# Patient Record
Sex: Female | Born: 1975 | Hispanic: No | Marital: Married | Smoking: Never smoker
Health system: Southern US, Community
[De-identification: ages and names within clinical notes are randomized; demographics above are authoritative.]

## PROBLEM LIST (undated history)

## (undated) ENCOUNTER — Inpatient Hospital Stay (HOSPITAL_COMMUNITY): Payer: Self-pay

## (undated) DIAGNOSIS — Z789 Other specified health status: Secondary | ICD-10-CM

## (undated) HISTORY — PX: NO PAST SURGERIES: SHX2092

## (undated) HISTORY — DX: Other specified health status: Z78.9

---

## 1997-07-29 DIAGNOSIS — K2970 Gastritis, unspecified, without bleeding: Secondary | ICD-10-CM

## 1997-07-29 HISTORY — DX: Gastritis, unspecified, without bleeding: K29.70

## 2001-04-10 ENCOUNTER — Other Ambulatory Visit: Admission: RE | Admit: 2001-04-10 | Discharge: 2001-04-10 | Payer: Self-pay | Admitting: Obstetrics and Gynecology

## 2001-05-06 ENCOUNTER — Encounter: Admission: RE | Admit: 2001-05-06 | Discharge: 2001-08-04 | Payer: Self-pay | Admitting: Obstetrics and Gynecology

## 2001-11-01 ENCOUNTER — Inpatient Hospital Stay (HOSPITAL_COMMUNITY): Admission: AD | Admit: 2001-11-01 | Discharge: 2001-11-04 | Payer: Self-pay | Admitting: *Deleted

## 2002-06-10 ENCOUNTER — Encounter: Admission: RE | Admit: 2002-06-10 | Discharge: 2002-06-10 | Payer: Self-pay | Admitting: Family Medicine

## 2002-07-07 ENCOUNTER — Encounter: Admission: RE | Admit: 2002-07-07 | Discharge: 2002-07-07 | Payer: Self-pay | Admitting: Family Medicine

## 2002-07-09 ENCOUNTER — Ambulatory Visit (HOSPITAL_COMMUNITY): Admission: RE | Admit: 2002-07-09 | Discharge: 2002-07-09 | Payer: Self-pay | Admitting: *Deleted

## 2002-07-09 ENCOUNTER — Emergency Department (HOSPITAL_COMMUNITY): Admission: EM | Admit: 2002-07-09 | Discharge: 2002-07-10 | Payer: Self-pay | Admitting: Emergency Medicine

## 2002-07-14 ENCOUNTER — Encounter: Admission: RE | Admit: 2002-07-14 | Discharge: 2002-07-14 | Payer: Self-pay | Admitting: Family Medicine

## 2002-08-10 ENCOUNTER — Encounter: Admission: RE | Admit: 2002-08-10 | Discharge: 2002-08-10 | Payer: Self-pay | Admitting: Family Medicine

## 2002-08-18 ENCOUNTER — Ambulatory Visit (HOSPITAL_COMMUNITY): Admission: RE | Admit: 2002-08-18 | Discharge: 2002-08-18 | Payer: Self-pay | Admitting: Family Medicine

## 2002-09-30 ENCOUNTER — Encounter: Admission: RE | Admit: 2002-09-30 | Discharge: 2002-09-30 | Payer: Self-pay | Admitting: Family Medicine

## 2002-10-01 ENCOUNTER — Encounter: Admission: RE | Admit: 2002-10-01 | Discharge: 2002-10-01 | Payer: Self-pay | Admitting: Family Medicine

## 2002-11-16 ENCOUNTER — Encounter: Admission: RE | Admit: 2002-11-16 | Discharge: 2002-11-16 | Payer: Self-pay | Admitting: Family Medicine

## 2002-12-01 ENCOUNTER — Encounter: Admission: RE | Admit: 2002-12-01 | Discharge: 2002-12-01 | Payer: Self-pay | Admitting: Family Medicine

## 2002-12-15 ENCOUNTER — Encounter: Admission: RE | Admit: 2002-12-15 | Discharge: 2002-12-15 | Payer: Self-pay | Admitting: Family Medicine

## 2002-12-22 ENCOUNTER — Encounter: Admission: RE | Admit: 2002-12-22 | Discharge: 2002-12-22 | Payer: Self-pay | Admitting: Family Medicine

## 2002-12-28 ENCOUNTER — Inpatient Hospital Stay (HOSPITAL_COMMUNITY): Admission: AD | Admit: 2002-12-28 | Discharge: 2002-12-30 | Payer: Self-pay | Admitting: *Deleted

## 2003-02-28 ENCOUNTER — Encounter: Admission: RE | Admit: 2003-02-28 | Discharge: 2003-02-28 | Payer: Self-pay | Admitting: Sports Medicine

## 2003-03-14 ENCOUNTER — Encounter: Admission: RE | Admit: 2003-03-14 | Discharge: 2003-03-14 | Payer: Self-pay | Admitting: Sports Medicine

## 2003-07-14 ENCOUNTER — Encounter: Admission: RE | Admit: 2003-07-14 | Discharge: 2003-07-14 | Payer: Self-pay | Admitting: Family Medicine

## 2003-07-20 ENCOUNTER — Encounter: Admission: RE | Admit: 2003-07-20 | Discharge: 2003-07-20 | Payer: Self-pay | Admitting: Sports Medicine

## 2003-09-14 ENCOUNTER — Encounter: Admission: RE | Admit: 2003-09-14 | Discharge: 2003-09-14 | Payer: Self-pay | Admitting: Family Medicine

## 2005-04-04 ENCOUNTER — Ambulatory Visit: Payer: Self-pay | Admitting: Family Medicine

## 2005-06-05 ENCOUNTER — Ambulatory Visit (HOSPITAL_COMMUNITY): Admission: RE | Admit: 2005-06-05 | Discharge: 2005-06-05 | Payer: Self-pay | Admitting: Family Medicine

## 2005-06-05 ENCOUNTER — Ambulatory Visit: Payer: Self-pay | Admitting: Family Medicine

## 2005-06-14 ENCOUNTER — Ambulatory Visit: Payer: Self-pay | Admitting: Family Medicine

## 2005-08-29 ENCOUNTER — Encounter (INDEPENDENT_AMBULATORY_CARE_PROVIDER_SITE_OTHER): Payer: Self-pay | Admitting: *Deleted

## 2005-08-29 LAB — CONVERTED CEMR LAB

## 2005-09-23 ENCOUNTER — Ambulatory Visit: Payer: Self-pay | Admitting: Family Medicine

## 2006-09-18 ENCOUNTER — Encounter: Payer: Self-pay | Admitting: Family Medicine

## 2006-09-18 ENCOUNTER — Ambulatory Visit: Payer: Self-pay | Admitting: Family Medicine

## 2006-09-18 LAB — CONVERTED CEMR LAB
Chlamydia, DNA Probe: NEGATIVE
GC Probe Amp, Genital: NEGATIVE

## 2006-09-26 ENCOUNTER — Encounter (INDEPENDENT_AMBULATORY_CARE_PROVIDER_SITE_OTHER): Payer: Self-pay | Admitting: *Deleted

## 2010-07-29 NOTE — L&D Delivery Note (Addendum)
Delivery Note At 4:30 AM a viable female was delivered via Vaginal, Spontaneous Delivery (Presentation: Right Occiput Anterior).  APGAR: 9, 9; weight 7 lb 6 oz (3345 g).   Placenta status: Intact, Spontaneous.  Cord: 3 vessels with the following complications: None.    Anesthesia: Epidural  Episiotomy: None Lacerations: 1st degree Suture Repair: 3.0 vicryl Est. Blood Loss (mL): < 250 cc  Mom to postpartum.  Baby to nursery-stable.  Plans to breast and bottle feed. Birth control - undecided.  [name],[name] 07/10/2011, 4:58 AM

## 2010-12-25 ENCOUNTER — Other Ambulatory Visit: Payer: Self-pay

## 2010-12-25 DIAGNOSIS — Z331 Pregnant state, incidental: Secondary | ICD-10-CM

## 2010-12-25 NOTE — Progress Notes (Signed)
Prenatal labs done today Cheryl Weaver 

## 2010-12-26 LAB — HIV ANTIBODY (ROUTINE TESTING W REFLEX): HIV: NONREACTIVE

## 2010-12-26 LAB — OBSTETRIC PANEL
Antibody Screen: NEGATIVE
Basophils Absolute: 0 10*3/uL (ref 0.0–0.1)
Basophils Relative: 0 % (ref 0–1)
Eosinophils Absolute: 0.1 10*3/uL (ref 0.0–0.7)
Eosinophils Relative: 2 % (ref 0–5)
HCT: 36.7 % (ref 36.0–46.0)
Hemoglobin: 11.9 g/dL — ABNORMAL LOW (ref 12.0–15.0)
Hepatitis B Surface Ag: NEGATIVE
Lymphocytes Relative: 23 % (ref 12–46)
Lymphs Abs: 1.8 10*3/uL (ref 0.7–4.0)
MCH: 28.9 pg (ref 26.0–34.0)
MCHC: 32.4 g/dL (ref 30.0–36.0)
MCV: 89.1 fL (ref 78.0–100.0)
Monocytes Absolute: 0.3 10*3/uL (ref 0.1–1.0)
Monocytes Relative: 4 % (ref 3–12)
Neutro Abs: 5.6 10*3/uL (ref 1.7–7.7)
Neutrophils Relative %: 72 % (ref 43–77)
Platelets: 202 10*3/uL (ref 150–400)
RBC: 4.12 MIL/uL (ref 3.87–5.11)
RDW: 14.4 % (ref 11.5–15.5)
Rh Type: POSITIVE
Rubella: 164.2 IU/mL — ABNORMAL HIGH
WBC: 7.8 10*3/uL (ref 4.0–10.5)

## 2010-12-26 LAB — SICKLE CELL SCREEN: Sickle Cell Screen: NEGATIVE

## 2010-12-27 LAB — CULTURE, OB URINE
Colony Count: NO GROWTH
Organism ID, Bacteria: NO GROWTH

## 2010-12-31 ENCOUNTER — Encounter: Payer: Self-pay | Admitting: Family Medicine

## 2011-01-04 ENCOUNTER — Ambulatory Visit (INDEPENDENT_AMBULATORY_CARE_PROVIDER_SITE_OTHER): Payer: Self-pay | Admitting: Family Medicine

## 2011-01-10 ENCOUNTER — Encounter: Payer: Self-pay | Admitting: Sports Medicine

## 2011-01-10 ENCOUNTER — Ambulatory Visit (INDEPENDENT_AMBULATORY_CARE_PROVIDER_SITE_OTHER): Payer: Self-pay | Admitting: Sports Medicine

## 2011-01-10 ENCOUNTER — Other Ambulatory Visit (HOSPITAL_COMMUNITY)
Admission: RE | Admit: 2011-01-10 | Discharge: 2011-01-10 | Disposition: A | Discharge status: Home / Self Care | Payer: Self-pay | Source: Ambulatory Visit | Attending: Family Medicine | Admitting: Family Medicine

## 2011-01-10 VITALS — BP 108/70 | Temp 98.4°F | Wt 172.0 lb

## 2011-01-10 DIAGNOSIS — Z3480 Encounter for supervision of other normal pregnancy, unspecified trimester: Secondary | ICD-10-CM

## 2011-01-10 DIAGNOSIS — Z01419 Encounter for gynecological examination (general) (routine) without abnormal findings: Secondary | ICD-10-CM | POA: Insufficient documentation

## 2011-01-10 DIAGNOSIS — Z331 Pregnant state, incidental: Secondary | ICD-10-CM | POA: Insufficient documentation

## 2011-01-10 LAB — GLUCOSE, CAPILLARY: Glucose-Capillary: 120 mg/dL — ABNORMAL HIGH (ref 70–99)

## 2011-01-10 MED ORDER — ADULT ONE DAILY GUMMIES PO CHEW: 1.0000 | CHEWABLE_TABLET | Freq: Every day | ORAL | Status: DC

## 2011-01-10 NOTE — Assessment & Plan Note (Addendum)
Estimated approx 15 wks. Needs early glucola Dating [place] GC/Chlam/PAP today PNV

## 2011-01-10 NOTE — Patient Instructions (Signed)
Great to see you, You are approximately 15 weeks but we need the ultrasound to be certain. Start taking your gummie prenatal vitamins. Checking your diabetes test. Come back to see [place] in 4 weeks, you will see Dr. Tye Savoy for your prenatal care.   She will deliver your baby as well.   Cheryl Weaver. Cheryl Weaver, M.D. [facility] [address] [place] [address] [phone]

## 2011-01-11 LAB — GC/CHLAMYDIA PROBE AMP, GENITAL
Chlamydia, DNA Probe: NEGATIVE
GC Probe Amp, Genital: NEGATIVE

## 2011-01-16 ENCOUNTER — Other Ambulatory Visit: Payer: Self-pay | Admitting: Sports Medicine

## 2011-01-16 DIAGNOSIS — Z331 Pregnant state, incidental: Secondary | ICD-10-CM

## 2011-02-12 ENCOUNTER — Ambulatory Visit (INDEPENDENT_AMBULATORY_CARE_PROVIDER_SITE_OTHER): Payer: Self-pay | Admitting: Family Medicine

## 2011-02-12 VITALS — BP 111/69 | Temp 98.1°F | Wt 172.8 lb

## 2011-02-12 DIAGNOSIS — Z331 Pregnant state, incidental: Secondary | ICD-10-CM

## 2011-02-13 ENCOUNTER — Inpatient Hospital Stay (HOSPITAL_COMMUNITY): Payer: Self-pay

## 2011-02-13 ENCOUNTER — Encounter (HOSPITAL_COMMUNITY): Payer: Self-pay | Admitting: Obstetrics and Gynecology

## 2011-02-13 ENCOUNTER — Encounter: Payer: Self-pay | Admitting: Family Medicine

## 2011-02-13 ENCOUNTER — Inpatient Hospital Stay (HOSPITAL_COMMUNITY)
Admission: AD | Admit: 2011-02-13 | Discharge: 2011-02-13 | Disposition: A | Discharge status: Home / Self Care | Payer: Self-pay | Source: Ambulatory Visit | Attending: Obstetrics and Gynecology | Admitting: Obstetrics and Gynecology

## 2011-02-13 DIAGNOSIS — R109 Unspecified abdominal pain: Secondary | ICD-10-CM | POA: Insufficient documentation

## 2011-02-13 DIAGNOSIS — O4690 Antepartum hemorrhage, unspecified, unspecified trimester: Secondary | ICD-10-CM

## 2011-02-13 DIAGNOSIS — O209 Hemorrhage in early pregnancy, unspecified: Secondary | ICD-10-CM | POA: Insufficient documentation

## 2011-02-13 LAB — URINALYSIS, ROUTINE W REFLEX MICROSCOPIC
Glucose, UA: NEGATIVE mg/dL
Ketones, ur: NEGATIVE mg/dL
Nitrite: NEGATIVE
Protein, ur: NEGATIVE mg/dL
pH: 6 (ref 5.0–8.0)

## 2011-02-13 LAB — WET PREP, GENITAL: Clue Cells Wet Prep HPF POC: NONE SEEN

## 2011-02-13 LAB — URINE MICROSCOPIC-ADD ON

## 2011-02-13 MED ORDER — ACETAMINOPHEN 325 MG PO TABS
650.0000 mg | ORAL_TABLET | Freq: Once | ORAL | Status: AC
  Administered 2011-02-13: 650 mg via ORAL
  Filled 2011-02-13: qty 2

## 2011-02-13 NOTE — ED Provider Notes (Addendum)
History     Chief Complaint  Patient presents with  . Vaginal Bleeding    x 2 days spotting  . Abdominal Pain    lower abdominal   HPI  Pt is 19weeks 4 days pregnant being seen at [facility].  She had onset of dark brown spotting 2 days ago and was seen yesterday at [facility].  A limited bedside u/s was performed since they were unable to find FHT w/doppler.  They told pt they wanted her to have a complete ultrasound.  They did G/Chlmaydia cultures which were negative, but I could not find a record of wet prep..  She had IC 2 weeks ago.  She denies nausea or constipation, UTI symptoms, or fever.  She has had some lower abdominal cramping.  She says she had an episode of spotting in June, but this is more.  She has filled a pad today.    Past Medical History  Diagnosis Date  . No pertinent past medical history     Past Surgical History  Procedure Date  . No past surgeries     No family history on file.  History  Substance Use Topics  . Smoking status: Not on file  . Smokeless tobacco: Not on file  . Alcohol Use: No    Allergies: No Known Allergies  Prescriptions prior to admission  Medication Sig Dispense Refill  . Multiple Vitamins-Minerals (ADULT ONE DAILY GUMMIES) CHEW Chew 1 tablet by mouth daily. Must have at least 0.4mg  folate.  90 tablet  3  . prenatal vitamin w/FE, FA (PRENATAL 1 + 1) 27-1 MG TABS Take 1 tablet by mouth daily.          ROS Physical Exam   Blood pressure 113/65, pulse 92, temperature 99.5 F (37.5 C), temperature source Oral, height 5\' 5"  (1.651 m), weight 174 lb 3.2 oz (79.017 kg), last menstrual period 09/27/2010.  Physical Exam  Vitals reviewed. Constitutional: She appears well-developed and well-nourished. No distress.  Eyes: Pupils are equal, round, and reactive to light.  Neck: Normal range of motion. Neck supple.  Respiratory: Effort normal.  GI: Soft. There is no tenderness.  Genitourinary:       Graves speculum very uncomfortable for pt so  cotton tipped swab used for wet prep.  No blood noted.  Bimanual- cervix closed- no blood on glove  Musculoskeletal: Normal range of motion.  Skin: Skin is warm and dry.    MAU Course  Procedures Pelvic exam performed with wet prep; ultrasound ordered Wet prep negative Ultrasound showed single living IUP.  [place] EGA is concordant with LMP.  No fetal anomalies seen involving visualized anatomy.  Normal amniotic fluid volume.  Normal cervical length.  No placental abruption or previa identified.  Assessment and Plan  Spotting in Pregnancy- unexplained  Follow up with OB provider  [facility] 02/13/2011, 3:11 PM

## 2011-02-13 NOTE — Patient Instructions (Addendum)
Verbal instructions given with Spanish interpreter present.  Transvaginal ultrasound scheduled for Friday 7/20 at Dr. Elsie Stain. Please schedule a lab appointment with Cheryl Weaver for a 3 hr GTT. If you experience vaginal bleeding, vaginal discharge, contractions, or decreased fetal activity, please go to MAU. Please follow up with me in 4 weeks. Thank you!

## 2011-02-13 NOTE — Progress Notes (Signed)
Spotting x 2 days with mild lower abdominal pain G3P2

## 2011-02-13 NOTE — Assessment & Plan Note (Signed)
Vaginal bleeding yesterday x 1 day. Small amount (similar to last day of period).  Had difficulty appreciating FHR with Doppler. Was able to see intra-uterine fetus with strong heart beat on ultrasound.  Scheduled a transvaginal ultrasound to r/o placenta previa at Dr. Elsie Stain office for Friday 7/20 @1 :30pm. Will follow up.  Will r/o infection today. GC/Chlamydia.  Patient needs to return to lab for a 3 hour GTT.  PTL precautions reviewed.

## 2011-02-13 NOTE — Progress Notes (Signed)
Pt presents to MaU with complaints of Lower abdominal pain and back pain with spotting times 2 days. Pt had intercourse last 2 weeks ago.

## 2011-02-13 NOTE — Progress Notes (Signed)
Vaginal bleeding yesterday x 1 day.  Small amount (similar to last day of period). Had difficulty appreciating FHR with Doppler.  Was able to see intra-uterine fetus with strong heart beat on ultrasound. Scheduled a transvaginal ultrasound to r/o placenta previa at Dr. Elsie Stain office for Friday 7/20 @1 :30pm.  Will follow up. Will r/o infection today.  GC/Chlamydia. Patient needs to return to lab for a 3 hour GTT. PTL precautions reviewed. If vaginal bleeding persists or she develops pain, fever, decreased fetal activity, patient to go to MAU.

## 2011-02-14 ENCOUNTER — Telehealth: Payer: Self-pay | Admitting: *Deleted

## 2011-02-14 NOTE — Telephone Encounter (Signed)
Miranes, Would please call this patient to explain below. She needs to schedule a 3 hour GTT because she fail ! Hour. Put on Lab Schedule. ---H[place] Dec

## 2011-02-14 NOTE — Telephone Encounter (Signed)
Message copied by Farrell Ours on Thu Feb 14, 2011  9:20 AM ------      Message from: [name], [name]      Created: Wed Feb 13, 2011  9:12 PM       Hey team.  This patient failed her 1 hr GTT and will need to return for a 3 hr GTT.  Can you please schedule a lab appointment with Kendal Hymen sometime this week or next?  She is Spanish speaking.  Thank you.

## 2011-02-14 NOTE — Telephone Encounter (Signed)
Message copied by Farrell Ours on Thu Feb 14, 2011  9:22 AM ------      Message from: [name], [name]      Created: Wed Feb 13, 2011  6:55 PM      Regarding: need an interpreter?       Hi guys, I want to let this patient know that the results of GC/Chlamydia were negative, but she speaks Spanish only.  Will there be an interpreter in clinic tomorrow who can translate for me?  I'll be there in the afternoon.  Let me know!  Thanks.

## 2011-02-14 NOTE — Telephone Encounter (Signed)
I tried to contact pt but phones numbers are not working  right. I contact husband but he was working and he was  unable to take any msg. Husband gave me another phone number but machine didn't permit a msg to be left. [employer]

## 2011-02-14 NOTE — Telephone Encounter (Signed)
Cheryl Weaver,  Please call patient and inform her that GC/C was negative. She is spanish speaking patient. ---[place] Dec

## 2011-03-13 ENCOUNTER — Ambulatory Visit (INDEPENDENT_AMBULATORY_CARE_PROVIDER_SITE_OTHER): Payer: Self-pay | Admitting: Family Medicine

## 2011-03-13 DIAGNOSIS — Z331 Pregnant state, incidental: Secondary | ICD-10-CM

## 2011-03-13 DIAGNOSIS — Z3480 Encounter for supervision of other normal pregnancy, unspecified trimester: Secondary | ICD-10-CM

## 2011-03-13 NOTE — Progress Notes (Signed)
Patient doing well.  Complains of sore throat, cough, and rhinorrhea x 1 day. Denies vaginal bleeding.  Recent U/S was within normal limits - no previa. 1 hr GTT 120.   PTL precautions and kick counts reviewed. Advised to take cough drops and/or Tylenol cold to relieve symptoms. Return to clinic in 4 weeks.

## 2011-03-13 NOTE — Patient Instructions (Signed)
Instructions were given verbally via Spanish interpretor. Schedule follow up visit with me in 4 weeks. Take cough drops and/or OTC Tylenol for cold symptoms.

## 2011-03-13 NOTE — Assessment & Plan Note (Signed)
Complains of sore throat, cough, and rhinorrhea x 1 day.  Denies vaginal bleeding. Recent U/S was within normal limits - no previa.  1 hr GTT 120.  PTL precautions and kick counts reviewed.  Advised to take cough drops and/or Tylenol cold to relieve symptoms.  Return to clinic in 4 weeks.

## 2011-03-24 ENCOUNTER — Inpatient Hospital Stay (HOSPITAL_COMMUNITY)
Admission: AD | Admit: 2011-03-24 | Discharge: 2011-03-24 | Disposition: A | Discharge status: Home / Self Care | Payer: Self-pay | Source: Ambulatory Visit | Attending: Obstetrics and Gynecology | Admitting: Obstetrics and Gynecology

## 2011-03-24 DIAGNOSIS — R05 Cough: Secondary | ICD-10-CM | POA: Insufficient documentation

## 2011-03-24 DIAGNOSIS — O99891 Other specified diseases and conditions complicating pregnancy: Secondary | ICD-10-CM | POA: Insufficient documentation

## 2011-03-24 DIAGNOSIS — R1084 Generalized abdominal pain: Secondary | ICD-10-CM

## 2011-03-24 DIAGNOSIS — R059 Cough, unspecified: Secondary | ICD-10-CM

## 2011-03-24 DIAGNOSIS — IMO0002 Reserved for concepts with insufficient information to code with codable children: Secondary | ICD-10-CM | POA: Insufficient documentation

## 2011-03-24 DIAGNOSIS — Z79899 Other long term (current) drug therapy: Secondary | ICD-10-CM | POA: Insufficient documentation

## 2011-03-24 DIAGNOSIS — X58XXXA Exposure to other specified factors, initial encounter: Secondary | ICD-10-CM | POA: Insufficient documentation

## 2011-03-24 LAB — URINALYSIS, ROUTINE W REFLEX MICROSCOPIC
Bilirubin Urine: NEGATIVE
Nitrite: NEGATIVE
Specific Gravity, Urine: 1.02 (ref 1.005–1.030)
Urobilinogen, UA: 1 mg/dL (ref 0.0–1.0)
pH: 6.5 (ref 5.0–8.0)

## 2011-03-24 LAB — URINE MICROSCOPIC-ADD ON

## 2011-03-24 MED ORDER — HYDROCOD POLST-CHLORPHEN POLST 10-8 MG/5ML PO LQCR: 5.0000 mL | Freq: Two times a day (BID) | ORAL | Status: DC

## 2011-03-24 MED ORDER — OXYCODONE-ACETAMINOPHEN 2.5-325 MG PO TABS: 1.0000 | ORAL_TABLET | ORAL | Status: AC | PRN

## 2011-03-24 NOTE — Progress Notes (Signed)
Pain started on right lower abdominal pain now bilateral lower abdominal pain, coughing x 10 days.

## 2011-03-24 NOTE — Progress Notes (Signed)
Pt reports lower abdominal pain since yesterday. Pt reports that the pain started on her R side and in now across her entire lower abdomen. Denies bleeding, LOF, diarrhea, vomiting. Pt states that the pain feels "like contractions"

## 2011-03-24 NOTE — ED Provider Notes (Signed)
History     Chief Complaint  Patient presents with  . Abdominal Pain   HPI  OB History    Grav Para Term Preterm Abortions TAB SAB Ect Mult Living   3 2 2  0 0 0 0 0 0 2      Past Medical History  Diagnosis Date  . No pertinent past medical history     Past Surgical History  Procedure Date  . No past surgeries     No family history on file.  History  Substance Use Topics  . Smoking status: Not on file  . Smokeless tobacco: Not on file  . Alcohol Use: No    Allergies: No Known Allergies  Prescriptions prior to admission  Medication Sig Dispense Refill  . Camphor-Eucalyptus-Menthol (VICKS VAPORUB EX) Apply 1 application topically at bedtime as needed. For chest congestion        . Multiple Vitamins-Minerals (MULTIVITAMIN & MINERAL PO) Take 1 tablet by mouth daily.        . prenatal vitamin w/FE, FA (PRENATAL 1 + 1) 27-1 MG TABS Take 1 tablet by mouth daily.        . Multiple Vitamins-Minerals (ADULT ONE DAILY GUMMIES) CHEW Chew 1 tablet by mouth daily. Must have at least 0.4mg  folate.  90 tablet  3    Review of Systems  Constitutional: Negative.   HENT: Negative.   Eyes: Negative.   Respiratory: Positive for cough.   Cardiovascular: Negative.   Gastrointestinal: Negative.   Genitourinary: Negative.   Musculoskeletal: Negative.   Skin: Negative.   Neurological: Negative.   Endo/Heme/Allergies: Negative.   Psychiatric/Behavioral: Negative.    Physical Exam   Last menstrual period 09/27/2010.  Physical Exam  Constitutional: She is oriented to person, place, and time. She appears well-developed and well-nourished.  HENT:  Head: Normocephalic.  Neck: Normal range of motion.  Cardiovascular: Normal rate, regular rhythm and normal heart sounds.   Respiratory: Effort normal and breath sounds normal.       Dry cough noted  GI: Soft. Bowel sounds are normal.       Gravid nontender  Genitourinary: Vagina normal.  Musculoskeletal: Normal range of motion.    Neurological: She is alert and oriented to person, place, and time. She has normal reflexes.  Skin: Skin is warm and dry.  Psychiatric: She has a normal mood and affect. Her behavior is normal. Judgment and thought content normal.    MAU Course  Procedures  MDM Muscle strain.  Assessment and Plan  FFN, if neg d/c home with Rx for cough.   Zerita Boers 03/24/2011, 4:56 PM

## 2011-03-24 NOTE — ED Provider Notes (Signed)
35 yo G3P2 @ 25 [redacted] wks gestation in with c/o persistant dry cough and low bilat abd pain. States had cold then apx 5 days ago started coughing and several days after that started having abd pain which is constant in nature and worse wgen she coughs. "feels like has pulled muscle." FFN obtained, SVE done Cx closed/firm/ post/ high.

## 2011-04-09 ENCOUNTER — Ambulatory Visit (INDEPENDENT_AMBULATORY_CARE_PROVIDER_SITE_OTHER): Payer: Self-pay | Admitting: Family Medicine

## 2011-04-09 DIAGNOSIS — O358XX0 Maternal care for other (suspected) fetal abnormality and damage, not applicable or unspecified: Secondary | ICD-10-CM

## 2011-04-09 DIAGNOSIS — Z331 Pregnant state, incidental: Secondary | ICD-10-CM

## 2011-04-09 NOTE — Patient Instructions (Signed)
It was good to see you again. If you experience any vaginal bleeding or gush of fluid, please go to [facility]. Please schedule an appointment with me in 4 weeks.

## 2011-04-10 LAB — CBC
HCT: 34.6 % — ABNORMAL LOW (ref 36.0–46.0)
Hemoglobin: 11.2 g/dL — ABNORMAL LOW (ref 12.0–15.0)
MCH: 27.1 pg (ref 26.0–34.0)
MCHC: 32.4 g/dL (ref 30.0–36.0)
MCV: 83.6 fL (ref 78.0–100.0)
RBC: 4.14 MIL/uL (ref 3.87–5.11)

## 2011-04-10 NOTE — Progress Notes (Signed)
Addended by: Tye Savoy, [name] on: 04/10/2011 02:10 PM   Modules accepted: Level of Service

## 2011-04-10 NOTE — Progress Notes (Signed)
Patient went to MAU approx. 10 days ago because she was having pelvic pain - diagnosed with round ligament pain/sent home. Otherwise, she has no other complaints/concerns. Will order CBC/HIV/RPR today and ask patient to fill out PMH/PHQ-9 forms. PTL precautions and kick counts reviewed. Return to clinic in 4 weeks.

## 2011-05-09 ENCOUNTER — Encounter: Payer: Self-pay | Admitting: Family Medicine

## 2011-05-15 ENCOUNTER — Encounter: Payer: Self-pay | Admitting: Family Medicine

## 2011-05-15 ENCOUNTER — Ambulatory Visit (INDEPENDENT_AMBULATORY_CARE_PROVIDER_SITE_OTHER): Payer: Self-pay | Admitting: Family Medicine

## 2011-05-15 DIAGNOSIS — Z331 Pregnant state, incidental: Secondary | ICD-10-CM

## 2011-05-15 LAB — GLUCOSE, CAPILLARY: Glucose-Capillary: 173 mg/dL — ABNORMAL HIGH (ref 70–99)

## 2011-05-15 NOTE — Assessment & Plan Note (Signed)
Continues to complain of pelvic pain and now low back pain - pain is intermittent and lasts for a few minutes. This is likely round ligament pain and recommended Tylenol prn and heating pads. Denies any vaginal bleeding at this time. Reviewed recent lab results with patient. Repeated one hour GTT today (she had an early GTT that was negative). PTL precautions and kick counts reviewed. Breast and bottle feeding. Birth control: undecided. Return to OB clinic with Dr. Jordan in 3 weeks (next visit), then return to Dr. [name]. 

## 2011-05-15 NOTE — Patient Instructions (Signed)
Please schedule an appointment with Dr. Swaziland in one week at the [facility] clinic. Then schedule a follow up appointment with me in 4 weeks. If you experience a gush of fluid, vaginal bleeding, or decreased fetal movement, please go to MAU.  Round Ligament Pain The round ligament is made up of muscle and fibrous tissue. It is attached to the uterus near the fallopian tube. The round ligament is located on both sides of the uterus and helps support the position of the uterus. It usually begins in the second trimester of pregnancy when the uterus comes out of the pelvis. The pain can come and go until the baby is delivered. Round ligament pain is not a serious problem and does not cause harm to the baby. CAUSE During pregnancy the uterus grows the most from the second trimester to delivery. As it grows, it stretches and slightly twists the round ligaments. When the uterus leans from one side to the other, the round ligament on the opposite side pulls and stretches. This can cause pain. SYMPTOMS Pain can occur on one side or both sides. The pain is usually a short, sharp and pinching like. Sometimes it can be a dull, lingering and aching pain. The pain is located in the lower side of the abdomen or in the groin. The pain is internal and usually starts deep in the groin and moves up to the outside of the hip area. Pain can occur with:  Sudden change in position like getting out of bed or a chair.  Rolling over in bed.   Coughing or sneezing.  Walking too much.   Any type of physical activity.   DIAGNOSIS Your caregiver will make sure there are no serious problems causing the pain. When nothing serious is found, the symptoms usually indicate that the pain is from the round ligament. TREATMENT  Sit down and relax when the pain starts.   Flex your knees up to your belly.   Lay on your side with a pillow under your belly (abdomen) and another one between your legs.   Sit in a hot bath for 15 to 20  minutes or until the pain goes away.  HOME CARE INSTRUCTIONS  Only take over-the-counter or prescriptions medicines for pain, discomfort or fever as directed by your caregiver.   Sit and stand slowly.   Avoid long walks if it causes pain.   Stop or lessen your physical activities if it causes pain.  SEEK MEDICAL CARE IF:  The pain does not go away with any of your treatment.   You need stronger medication for the pain.   You develop back pain that you did not have before with the side pain.  SEEK IMMEDIATE MEDICAL CARE IF:  You develop a temperature of 101.5 or higher.   You develop uterine contractions.   You develop vaginal bleeding.   You develop nausea, vomiting or diarrhea.   You develop chills.   You have pain when you urinate.

## 2011-05-15 NOTE — Progress Notes (Signed)
Continues to complain of pelvic pain and now low back pain - pain is intermittent and lasts for a few minutes. This is likely round ligament pain and recommended Tylenol prn and heating pads. Denies any vaginal bleeding at this time. Reviewed recent lab results with patient. Repeated one hour GTT today (she had an early GTT that was negative). PTL precautions and kick counts reviewed. Breast and bottle feeding. Birth control: undecided. Return to [facility] clinic with Dr. Swaziland in 3 weeks (next visit), then return to Dr. Tye Savoy.

## 2011-05-17 ENCOUNTER — Other Ambulatory Visit: Payer: Self-pay | Admitting: Family Medicine

## 2011-05-17 ENCOUNTER — Other Ambulatory Visit: Payer: Self-pay

## 2011-05-17 DIAGNOSIS — Z331 Pregnant state, incidental: Secondary | ICD-10-CM

## 2011-05-18 LAB — GLUCOSE TOLERANCE, 3 HOURS
Glucose Tolerance, 1 hour: 159 mg/dL (ref 70–189)
Glucose Tolerance, 2 hour: 129 mg/dL (ref 70–164)
Glucose, GTT - 3 Hour: 90 mg/dL (ref 70–144)

## 2011-05-30 ENCOUNTER — Ambulatory Visit (INDEPENDENT_AMBULATORY_CARE_PROVIDER_SITE_OTHER): Payer: Self-pay | Admitting: Family Medicine

## 2011-05-30 ENCOUNTER — Encounter: Payer: Self-pay | Admitting: Family Medicine

## 2011-05-30 VITALS — BP 114/75 | Temp 98.5°F | Wt 181.8 lb

## 2011-05-30 DIAGNOSIS — Z331 Pregnant state, incidental: Secondary | ICD-10-CM

## 2011-05-30 DIAGNOSIS — Z23 Encounter for immunization: Secondary | ICD-10-CM

## 2011-05-30 NOTE — Patient Instructions (Signed)
It was nice to meet you today. If you have contractions that come every 20 minutes, or if your water breaks, or if you have bleeding, or if your baby is not moving well (at least 10 times in 2 hours), then please go to [facility] hospital.  Fue un placer conocerte hoy.Si tiene contracciones que vienen cada 20 minutos, o si se rompe la bolsa, o si tiene sangrado, o si su beb no se mueve bien (al menos 10 veces en 2 horas), luego vaya a un hospital de [facility]

## 2011-05-30 NOTE — Progress Notes (Signed)
Pt with pelvic and back pain, consistent with pain she has had. Reviewed her 3 hour results. No vaginal discharge. Contraception - undecided. A/P Pregnancy - going well.  GBS/GC?CZ next visit.  Kick counts and PTL precautions reviewed.

## 2011-05-30 NOTE — ED Provider Notes (Signed)
Agree with above note.  Cheryl Weaver 05/30/2011 12:32 PM

## 2011-06-10 ENCOUNTER — Encounter: Payer: Self-pay | Admitting: Family Medicine

## 2011-06-11 ENCOUNTER — Ambulatory Visit (INDEPENDENT_AMBULATORY_CARE_PROVIDER_SITE_OTHER): Payer: Self-pay | Admitting: Family Medicine

## 2011-06-11 VITALS — BP 109/76 | Temp 98.1°F | Wt 182.0 lb

## 2011-06-11 DIAGNOSIS — Z331 Pregnant state, incidental: Secondary | ICD-10-CM

## 2011-06-11 NOTE — Patient Instructions (Signed)
Please return to clinic in one week. If you experience a gush of fluid, blood, decreased fetal movement, or regular contractions, please go [facility].  Contracciones de Braxton Hicks (Braxton Hicks Contractions) Usted presenta un falso trabajo de [place]. Durante todo el embarazo aparecen con frecuencia contracciones del tero. Hacia el final del embarazo (32-34 semanas) estas contracciones (Braxton Hicks) pueden hacerse ms fuertes. No se trata de un trabajo de parto verdadero porque no producen un agrandamiento (dilatacin) y afinamiento del cuello del tero. Algunas veces resulta difcil distinguirlas del trabajo de parto verdadero porque en algunos casos llegan a ser muy intensas y las personas tienen distinta tolerancia al [employer] No debe sentirse avergonzada si ingresa al hospital con un falso trabajo de [place]. En ocasiones la nica forma de saber si est en un parto verdadero es observar los cambios en el cuello del tero. A veces, la nica forma de saber si realmente est en trabajo de parto es para el mdico observar los cambios en el tero. Como diferenciar el [place] de parto falso del verdadero:  Aggie Cosier de parto falso.   Las contracciones falsas generalmente duran menos y no son tan intensas como las verdaderas.   Generalmente se sienten en la zona inferior del abdomen y en la ingle.   Pueden aliviarse con una caminata o cambiar de posicin mientras se est acostada.   A medida que pasa el tiempo son ms cortas y dbiles.   Generalmente son irregulares.   No se hacen progresivamente ms intensas y Her[employer] entre s [employer].   Trabajo de parto verdadero.   Las contracciones verdaderas duran de 30 a 70 segundos, son ms regulares, generalmente se hacen ms intensas y Comptroller.   No desaparecen al caminar.   La molestia generalmente se siente en la parte superior del tero y se extiende hacia la zona inferior del abdomen y [employer] cintura.   El  profesional que la asiste podr examinarla para determinar si el trabajo de parto es verdadero. El examen mostrar si el cuello del tero se est dilatando y [place].  Si no hay problemas prenatales u otras complicaciones de la salud asociadas al embarazo, no habr inconvenientes si la envan a su casa y espera el comienzo del verdadero trabajo de [place]. INSTRUCCIONES PARA EL CUIDADO DOMICILIARIO  Siga con los ejercicios y las indicaciones habituales.   Tome los medicamentos como se le indic.   Cumpla con las citas regularmente.   Coma y beba ligero si cree que dar a luz.   Si se siente incmoda por las contracciones:   Cambie de [place], si est acostada o en reposo, camine y si est caminando, repose.   Sintense y repose en una baadera con agua caliente.   Beba entre 2 y 3 vasos de [place]. La deshidratacin puede causar contracciones BH.   Respire lenta y profundamente varias veces por hora.  SOLICITE ATENCIN MDICA DE INMEDIATO SI:  Las contracciones se intensifican, se hacen ms regulares y [employer] s.   Tiene una prdida importante de lquido de la vagina   La temperatura oral se eleva sin motivo por encima de 102 F (38.9 C) o segn le indique el profesional que la asiste.   Elimina una mucosidad sanguinolenta.   Presenta hemorragia vaginal.   Presenta dolor abdominal constante.   Siente un dolor en la parte baja de la espalda que nunca haba sentido antes.   Siente que el beb empuja hacia abajo y le causa presin  plvica.   El beb no se mueve tanto como antes.  Document Released: 04/24/2005 Document Revised: 03/27/2011 [facility] Patient Information 2012 [place], [place].

## 2011-06-12 LAB — GC/CHLAMYDIA PROBE AMP, GENITAL
Chlamydia, DNA Probe: NEGATIVE
GC Probe Amp, Genital: NEGATIVE

## 2011-06-12 NOTE — Progress Notes (Signed)
Patient still complains of round ligament pain. Now with [facility] contractions. Will perform GC/chlamydia and GBS cultures today. Kick counts and PTL precautions reviewed. Contraception: undecided Plans to breast and bottle feed. Return to clinic in 1 week with Dr. Tye Savoy.

## 2011-06-13 LAB — STREP B DNA PROBE: GBSP: POSITIVE

## 2011-06-18 ENCOUNTER — Encounter: Payer: Self-pay | Admitting: Family Medicine

## 2011-06-18 ENCOUNTER — Ambulatory Visit (INDEPENDENT_AMBULATORY_CARE_PROVIDER_SITE_OTHER): Payer: Self-pay | Admitting: Family Medicine

## 2011-06-18 VITALS — BP 113/77 | Temp 97.9°F | Wt 181.0 lb

## 2011-06-18 DIAGNOSIS — Z3400 Encounter for supervision of normal first pregnancy, unspecified trimester: Secondary | ICD-10-CM

## 2011-06-18 NOTE — Progress Notes (Signed)
Patient complains of moderate to severe pelvic pain. Reassured patient that this is normal and may get worse prior to delivery. Patient has taken over-the-counter Tylenol as needed for pain, which has helped. Recommended the patient sleep on her side, a and apply heating pads or ice packs to affected areas as needed.  GBS positive-explained this to patient. Patient plans to both breast feed and bottle feed. Kick counts and labor  precautions reviewed. Return to clinic in one week.

## 2011-06-27 ENCOUNTER — Encounter: Payer: Self-pay | Admitting: Family Medicine

## 2011-06-27 ENCOUNTER — Ambulatory Visit: Payer: Self-pay | Admitting: Family Medicine

## 2011-06-27 VITALS — BP 118/82 | Temp 98.6°F | Wt 184.7 lb

## 2011-06-27 DIAGNOSIS — Z3490 Encounter for supervision of normal pregnancy, unspecified, unspecified trimester: Secondary | ICD-10-CM

## 2011-06-27 NOTE — Progress Notes (Signed)
Complains of low back pain and feeling more tired. Does not want to take Tylenol for pain. Handout given regarding "pain in pregnancy". Plans to get an epidural. Plans to breast and bottle feed. Birth control: still undecided, possibly IUD. Labor precautions reviewed with interpreter. Return to clinic in one week.  Will discuss post-dates again at that time.

## 2011-06-27 NOTE — Patient Instructions (Signed)
Labor precautions reviewed with patient via spanish interpreter.  Alivio del dolor durante el trabajo de parto y el nacimiento (Pain Relief During Labor and Delivery) Todas las mujeres son diferentes cuando Psychiatric nurse. El dolor que una mujer experimenta durante el [place] de parto y el nacimiento depende de su tolerancia al dolor, fortaleza de la contraccin y el tamao y posicin del beb.  Hay muchas formas de prepararse para Human resources officer. Ellas pueden ser:  Tomar clases prenatales para aprender acerca del trabajo de parto y el nacimiento. Cuanto ms informada est, sentir menos ansiedad y [place], lo que puede ayudar a Teacher, early years/pre.   Tomar medicamentos o anestsicos durante el trabajo de parto y el nacimiento.   Las [employer] prefieren dar a luz de [place] natural aprenden tcnicas de respiracin y relajacin y cmo relajarse entre contracciones para Human resources officer. Podrn tomar un bao o ducha, recibir un masaje por parte de su pareja o colocar una bolsa de hielo en su espalda, o simplemente cambiar de posicin durante el trabajo de [place].  MTODOS DISPONIBLES PARA CONTROLAR EL DOLOR:  Se podrn administrar medicamentos mediante una inyeccin en los msculos o vena (intravenosa) para Engineer, materials. Estos medicamentos pueden administrarse junto con otros (barbitricos) para relajarse. Estos medicamentos la [employer]. Podr haber efectos secundarios menores como nuseas, problemas para concentrarse, adormecimiento y bajo ritmo cardaco del beb. Sin embargo, la dosis administrada no afectar gravemente al beb.   El bloqueo paracervical se administra durante el parto mediante la inyeccin de medicamento adormecedores a los lados derecho e izquierdo del tero y la vagina. Esto [employer] mayor parte de los dolores del [place] de [place]. Se podr administrar ms de una vez.   Se inyectar un anestsico local bajo la piel, por fuera de la vagina, en la zona perineal  para realizar un pequeo corte (episiotoma) Este corte se realiza para prevenir el degarro de la vagina en el momento del [place].   La anestesia pudenda es una inyeccin que se administra a travs del perineo hacia la zona de los nervios pudendos. Esto adormece la parte externa de la vagina y la zona del perineo en el momento del [place].   La anestesia epidural es una inyeccin de un medicamento que adormece la zona ms baja de la espalda y la columna vertebral. Esta se inyecta por fuera de la cubierta de la mdula espinal. Una epidural adormece la mitad inferior del cuerpo. Puede administrarse de manera continua en pequeas dosis a travs de un tubo para obtener una anestesia prolongada. Tambin puede utilizarse para Surveyor, mining. Podr mover las piernas, pero no podr Advertising account planner. La epidural es una forma comn y popular de anestesia durante el trabajo de parto y el nacimiento. Slo debe administrarse por un anestesilogo o anestesista entrenado y experimentado. Sin embargo, pueden ocurrir efectos secundarios como por ejemplo:   Dolor de [place].   Disminucin de la presin arterial.   Dolor de espalda.   Mareos.   Dificultades para respirar si afecta los msculos del trax.   Escalofros.   Enlentecimiento de las contracciones.   Parto que requiera frceps o extraccin al vaco.   Mayor incidencia de cesrea.   Rara vez puede causarle convulsiones.   La anestesia del bloqueo espinal es una inyeccin de un medicamento adormecedor en la zona baja de la espalda y la columna vertebral. Sin embargo, sta se inyecta por dentro de la cubierta de la mdula espinal. La anestesia de bloqueo espinal  slo se administra una vez. Por lo tanto es mejor darla justo antes del nacimiento del beb. Tambin puede utilizarse para Surveyor, mining. Los efectos secundarios son los mismos que los de la epidural Slo debe administrarse por un anestesilogo o anestesista entrenado.  El hecho de que una  persona tome o no medicamentos durante el trabajo de parto y el nacimiento depender de las necesidades de cada individuo y situacin. No tenga miedo de pedir medicamentos para el dolor si los necesita. No hay razn para sentir vergenza, pudor o sentir que se falla a s misma si toma medicamentos. Deber conversar con el mdico acerca de cmo y cuando planear el control del dolor en el embarazo durante las visitas prenatales. Tambin podra ser Neomia Dear buena idea hablar con un anestesilogo antes de la fecha de [place]. Document Released: 05/12/2009 Document Revised: 03/27/2011 [facility] Patient Information 2012 [place], [place].

## 2011-07-02 ENCOUNTER — Ambulatory Visit (INDEPENDENT_AMBULATORY_CARE_PROVIDER_SITE_OTHER): Payer: Self-pay | Admitting: Family Medicine

## 2011-07-02 VITALS — BP 98/64 | Temp 98.1°F | Wt 185.0 lb

## 2011-07-02 DIAGNOSIS — Z3480 Encounter for supervision of other normal pregnancy, unspecified trimester: Secondary | ICD-10-CM

## 2011-07-02 DIAGNOSIS — Z3490 Encounter for supervision of normal pregnancy, unspecified, unspecified trimester: Secondary | ICD-10-CM

## 2011-07-02 NOTE — Patient Instructions (Signed)
Instructions given verbally with Spanish interpreter.

## 2011-07-02 NOTE — Progress Notes (Signed)
Patient presents to clinic at 39.5 weeks. No complaints at this time - ready to have baby. Plans to breast and bottle feed.  Birth control: still undecided, possibly IUD.  Labor precautions reviewed with interpreter.  Return to [facility] clinic in one week (I do not have clinic next week). If patient still pregnant, will need to schedule IOL at 41 weeks.

## 2011-07-04 ENCOUNTER — Telehealth: Payer: Self-pay | Admitting: Family Medicine

## 2011-07-04 ENCOUNTER — Telehealth (HOSPITAL_COMMUNITY): Payer: Self-pay | Admitting: Family Medicine

## 2011-07-04 DIAGNOSIS — Z3490 Encounter for supervision of normal pregnancy, unspecified, unspecified trimester: Secondary | ICD-10-CM

## 2011-07-04 NOTE — Telephone Encounter (Signed)
Pt is aware about induction day at [facility] 12/13 @7 :30am  [place]

## 2011-07-04 NOTE — Telephone Encounter (Signed)
SO I was just informed that this patient will need to have BPP/NST at [facility] twice this week prior to induction.  Can you please schedule her first appointment?  [facility] will schedule the second.  Thank you!

## 2011-07-04 NOTE — Telephone Encounter (Signed)
Can you put in an order for this please -----Cheryl Weaver

## 2011-07-04 NOTE — Telephone Encounter (Signed)
I have scheduled this patient's induction of labor for 07/11/11 @ 7:30 AM.  If you have time this morning, please grab a Spanish interpreter (nicely) and ask if she can call patient to let her know.  Otherwise, I will try to call this afternoon in clinic from the language line.  Let me know if you get to it before I do.

## 2011-07-04 NOTE — Telephone Encounter (Signed)
Addended by: Tye Savoy, Kallee Nam on: 07/04/2011 02:16 PM   Modules accepted: Orders

## 2011-07-07 ENCOUNTER — Encounter (HOSPITAL_COMMUNITY): Payer: Self-pay | Admitting: *Deleted

## 2011-07-07 ENCOUNTER — Inpatient Hospital Stay (HOSPITAL_COMMUNITY)
Admission: AD | Admit: 2011-07-07 | Discharge: 2011-07-07 | Disposition: A | Discharge status: Home / Self Care | Payer: Self-pay | Source: Ambulatory Visit | Attending: Family Medicine | Admitting: Family Medicine

## 2011-07-07 DIAGNOSIS — O99891 Other specified diseases and conditions complicating pregnancy: Secondary | ICD-10-CM | POA: Insufficient documentation

## 2011-07-07 DIAGNOSIS — R059 Cough, unspecified: Secondary | ICD-10-CM | POA: Insufficient documentation

## 2011-07-07 DIAGNOSIS — J029 Acute pharyngitis, unspecified: Secondary | ICD-10-CM | POA: Insufficient documentation

## 2011-07-07 DIAGNOSIS — R05 Cough: Secondary | ICD-10-CM | POA: Insufficient documentation

## 2011-07-07 DIAGNOSIS — J069 Acute upper respiratory infection, unspecified: Secondary | ICD-10-CM | POA: Insufficient documentation

## 2011-07-07 NOTE — ED Provider Notes (Signed)
History   Cheryl Weaver is a 35 y.o. G3P2002 at [redacted]w[redacted]d presenting with reports of sore throat, head congestion, dry cough, occasional nausea and headache since 0400.  Two children in the household have the flu.  Pt received flu vaccine ~3wks ago.  Denies fever/chills, vomiting/diarrhea.  Able to keep fluids and food down.  Reports decreased fetal movement in last 3 hours, occasional UC's.  Denies VB or LOF.  IOL scheduled for 07/11/11.    Chief Complaint  Patient presents with   Otalgia   Cough   Sore Throat   Headache   Decreased Fetal Movement   HPI  OB History    Grav Para Term Preterm Abortions TAB SAB Ect Mult Living   3 2 2  0 0 0 0 0 0 2      Past Medical History  Diagnosis Date   No pertinent past medical history     Past Surgical History  Procedure Date   No past surgeries     History reviewed. No pertinent family history.  History  Substance Use Topics   Smoking status: Not on file   Smokeless tobacco: Not on file   Alcohol Use: No    Allergies: No Known Allergies  Prescriptions prior to admission  Medication Sig Dispense Refill   Camphor-Eucalyptus-Menthol (VICKS VAPORUB EX) Apply 1 application topically at bedtime as needed. For chest congestion         chlorpheniramine-HYDROcodone (TUSSIONEX PENNKINETIC ER) 10-8 MG/5ML LQCR Take 5 mLs by mouth every 12 (twelve) hours.  140 mL  0   Multiple Vitamins-Minerals (ADULT ONE DAILY GUMMIES) CHEW Chew 1 tablet by mouth daily. Must have at least 0.4mg  folate.  90 tablet  3   DISCONTD: Multiple Vitamins-Minerals (MULTIVITAMIN & MINERAL PO) Take 1 tablet by mouth daily.         DISCONTD: prenatal vitamin w/FE, FA (PRENATAL 1 + 1) 27-1 MG TABS Take 1 tablet by mouth daily.          Review of Systems  Constitutional: Negative.  Negative for fever.  HENT: Positive for ear pain (Rt), congestion and sore throat.   Eyes: Negative.   Respiratory: Positive for cough. Negative for sputum production,  shortness of breath and wheezing.   Cardiovascular: Negative.   Gastrointestinal: Positive for nausea (not at present). Negative for vomiting, abdominal pain and diarrhea.  Genitourinary: Negative.   Musculoskeletal: Negative.   Skin: Negative.   Neurological: Positive for headaches.  Endo/Heme/Allergies: Negative.   Psychiatric/Behavioral: Negative.    Physical Exam   Blood pressure 124/72, pulse 94, temperature 98.3 F (36.8 C), temperature source Oral, resp. rate 20, height 5' 1.5" (1.562 m), weight 85.73 kg (189 lb), last menstrual period 09/27/2010.  Physical Exam  Constitutional: She is oriented to person, place, and time. She appears well-developed and well-nourished.  HENT:  Head: Normocephalic.       Fluid behind bilateral ear drums, no drainage.  Eyes: Pupils are equal, round, and reactive to light.  Neck: Normal range of motion.  Cardiovascular: Normal rate and regular rhythm.   Respiratory: Effort normal and breath sounds normal.  GI: Soft.  Genitourinary: Vagina normal and uterus normal.  Musculoskeletal: Normal range of motion.  Neurological: She is alert and oriented to person, place, and time. She has normal reflexes.  Skin: Skin is warm and dry.  Psychiatric: She has a normal mood and affect. Her behavior is normal. Judgment and thought content normal.   SVE (per pt request): 3/50/-2, vtx, intact  FHR: initially minimal variability with accels without decels, improved to moderate variability w/ accels and no decels with maternal position change to Lt  MAU Course  Procedures Ear exam   Assessment and Plan  A: IUP @ 40.3wks     URI vs. flu     Cat I FHR, occasional mild UC's     Stable/maternal unit  P: D/C home     Treat symptoms with OTC meds (list given)     Discussed importance of not laying flat on back     Return if symptoms worsen or labor, srom, VB, or decreased FM     Keep appointment for IOL as scheduled 07/11/11  Assessment and d/c  instructions completed with assistance from spanish interpreter  Plan of care reviewed w/ Zerita Boers, CNM prior to d/c (who also assessed pt's ears)  Joellyn Haff, SNM 07/07/2011, 10:48 PM

## 2011-07-07 NOTE — Progress Notes (Signed)
Pt G3 P2 at 40.3wks having cold symptoms that started this am.  Family at home dx with the flu over the weekend.  Pt reports decreased fetal movement today, now having contractions.

## 2011-07-07 NOTE — Progress Notes (Signed)
K. Booker, CNM student at bedside. Assessment done and poc discussed with pt. 

## 2011-07-07 NOTE — Progress Notes (Signed)
Pt presents to mau for c/o not feeling well since 4am.  Denies fever.

## 2011-07-08 ENCOUNTER — Encounter (HOSPITAL_COMMUNITY): Payer: Self-pay | Admitting: *Deleted

## 2011-07-08 ENCOUNTER — Telehealth (HOSPITAL_COMMUNITY): Payer: Self-pay | Admitting: *Deleted

## 2011-07-08 NOTE — Telephone Encounter (Signed)
Preadmission screen  

## 2011-07-09 ENCOUNTER — Inpatient Hospital Stay (HOSPITAL_COMMUNITY)
Admission: AD | Admit: 2011-07-09 | Discharge: 2011-07-12 | DRG: 775 | Disposition: A | Discharge status: Home / Self Care | Payer: Medicaid Other | Source: Ambulatory Visit | Attending: Obstetrics & Gynecology | Admitting: Obstetrics & Gynecology

## 2011-07-09 ENCOUNTER — Encounter (HOSPITAL_COMMUNITY): Payer: Self-pay | Admitting: Anesthesiology

## 2011-07-09 ENCOUNTER — Inpatient Hospital Stay (HOSPITAL_COMMUNITY): Payer: Medicaid Other | Admitting: Anesthesiology

## 2011-07-09 ENCOUNTER — Encounter (HOSPITAL_COMMUNITY): Payer: Self-pay

## 2011-07-09 DIAGNOSIS — Z3490 Encounter for supervision of normal pregnancy, unspecified, unspecified trimester: Secondary | ICD-10-CM

## 2011-07-09 LAB — CBC
MCHC: 32.3 g/dL (ref 30.0–36.0)
RDW: 16.7 % — ABNORMAL HIGH (ref 11.5–15.5)

## 2011-07-09 MED ORDER — ONDANSETRON HCL 4 MG/2ML IJ SOLN: 4.0000 mg | Freq: Four times a day (QID) | INTRAMUSCULAR | Status: DC | PRN

## 2011-07-09 MED ORDER — ACETAMINOPHEN 325 MG PO TABS: 650.0000 mg | ORAL_TABLET | ORAL | Status: DC | PRN

## 2011-07-09 MED ORDER — LACTATED RINGERS IV SOLN
500.0000 mL | INTRAVENOUS | Status: DC | PRN
  Administered 2011-07-10: 500 mL via INTRAVENOUS

## 2011-07-09 MED ORDER — CITRIC ACID-SODIUM CITRATE 334-500 MG/5ML PO SOLN: 30.0000 mL | ORAL | Status: DC | PRN

## 2011-07-09 MED ORDER — OXYTOCIN 20 UNITS IN LACTATED RINGERS INFUSION - SIMPLE: 125.0000 mL/h | Freq: Once | INTRAVENOUS | Status: DC

## 2011-07-09 MED ORDER — IBUPROFEN 600 MG PO TABS: 600.0000 mg | ORAL_TABLET | Freq: Four times a day (QID) | ORAL | Status: DC | PRN

## 2011-07-09 MED ORDER — FENTANYL 2.5 MCG/ML BUPIVACAINE 1/10 % EPIDURAL INFUSION (WH - ANES)
14.0000 mL/h | INTRAMUSCULAR | Status: DC
  Administered 2011-07-09 – 2011-07-10 (×2): 14 mL/h via EPIDURAL
  Filled 2011-07-09 (×2): qty 60

## 2011-07-09 MED ORDER — LIDOCAINE HCL 1.5 % IJ SOLN
INTRAMUSCULAR | Status: DC | PRN
  Administered 2011-07-09 (×2): 5 mL via EPIDURAL

## 2011-07-09 MED ORDER — FLEET ENEMA 7-19 GM/118ML RE ENEM: 1.0000 | ENEMA | RECTAL | Status: DC | PRN

## 2011-07-09 MED ORDER — EPHEDRINE 5 MG/ML INJ: 10.0000 mg | INTRAVENOUS | Status: DC | PRN

## 2011-07-09 MED ORDER — LACTATED RINGERS IV SOLN
500.0000 mL | Freq: Once | INTRAVENOUS | Status: AC
  Administered 2011-07-09: 500 mL via INTRAVENOUS

## 2011-07-09 MED ORDER — PHENYLEPHRINE 40 MCG/ML (10ML) SYRINGE FOR IV PUSH (FOR BLOOD PRESSURE SUPPORT): 80.0000 ug | PREFILLED_SYRINGE | INTRAVENOUS | Status: DC | PRN

## 2011-07-09 MED ORDER — OXYTOCIN BOLUS FROM INFUSION
500.0000 mL | Freq: Once | INTRAVENOUS | Status: DC
  Filled 2011-07-09: qty 500

## 2011-07-09 MED ORDER — NALBUPHINE SYRINGE 5 MG/0.5 ML: 5.0000 mg | INJECTION | INTRAMUSCULAR | Status: DC | PRN

## 2011-07-09 MED ORDER — DEXTROSE 5 % IV SOLN
2.5000 10*6.[IU] | INTRAVENOUS | Status: DC
  Administered 2011-07-10: 2.5 10*6.[IU] via INTRAVENOUS
  Filled 2011-07-09 (×4): qty 2.5

## 2011-07-09 MED ORDER — LIDOCAINE HCL (PF) 1 % IJ SOLN: 30.0000 mL | INTRAMUSCULAR | Status: DC | PRN

## 2011-07-09 MED ORDER — PENICILLIN G POTASSIUM 5000000 UNITS IJ SOLR
5.0000 10*6.[IU] | Freq: Once | INTRAVENOUS | Status: AC
  Administered 2011-07-09: 5 10*6.[IU] via INTRAVENOUS
  Filled 2011-07-09: qty 5

## 2011-07-09 MED ORDER — LACTATED RINGERS IV SOLN
INTRAVENOUS | Status: DC
  Administered 2011-07-09: via INTRAVENOUS

## 2011-07-09 MED ORDER — EPHEDRINE 5 MG/ML INJ
10.0000 mg | INTRAVENOUS | Status: DC | PRN
  Filled 2011-07-09: qty 4

## 2011-07-09 MED ORDER — PHENYLEPHRINE 40 MCG/ML (10ML) SYRINGE FOR IV PUSH (FOR BLOOD PRESSURE SUPPORT)
80.0000 ug | PREFILLED_SYRINGE | INTRAVENOUS | Status: DC | PRN
  Filled 2011-07-09: qty 5

## 2011-07-09 MED ORDER — OXYCODONE-ACETAMINOPHEN 5-325 MG PO TABS: 2.0000 | ORAL_TABLET | ORAL | Status: DC | PRN

## 2011-07-09 MED ORDER — DIPHENHYDRAMINE HCL 50 MG/ML IJ SOLN: 12.5000 mg | INTRAMUSCULAR | Status: DC | PRN

## 2011-07-09 NOTE — ED Provider Notes (Signed)
History     Chief Complaint  Patient presents with  . Labor Eval   HPI  Patient presents to MAU for labor check and painful contractions .  Scheduled for induction on 07/11/11.  Patient denies any leaking fluid, bloody show, discharge.  Endorses good fetal movement and Braxton Hicks contractions.  Denies any fever, chills, night sweats.  Does endorse cough, runny nose, and congestion.  OB History    Grav Para Term Preterm Abortions TAB SAB Ect Mult Living   3 2 2  0 0 0 0 0 0 2      Past Medical History  Diagnosis Date  . No pertinent past medical history   . Influenza     Past Surgical History  Procedure Date  . No past surgeries     Family History  Problem Relation Age of Onset  . Cancer Mother     cervical  . Diabetes Father     History  Substance Use Topics  . Smoking status: Never Smoker   . Smokeless tobacco: Never Used  . Alcohol Use: No    Allergies: No Known Allergies  Prescriptions prior to admission  Medication Sig Dispense Refill  . acetaminophen (TYLENOL) 500 MG tablet Take 500 mg by mouth every 6 (six) hours as needed. For pain       . Camphor-Eucalyptus-Menthol (VICKS VAPORUB EX) Apply 1 application topically at bedtime as needed. For chest congestion        . Multiple Vitamin (MULTIVITAMIN) tablet Take 1 tablet by mouth daily.          ROS  Per HPI  Physical Exam   Blood pressure 119/75, pulse 107, temperature 98.6 F (37 C), resp. rate 18, height 5\' 2"  (1.575 m), weight 86.183 kg (190 lb), last menstrual period 09/27/2010, SpO2 100.00%.  Physical Exam  Constitutional: She appears well-nourished. No distress.  HENT:  Mouth/Throat: Oropharynx is clear and moist.  Neck: Normal range of motion. Neck supple.  Cardiovascular: Normal rate, regular rhythm, normal heart sounds and intact distal pulses.  Exam reveals no gallop and no friction rub.   No murmur heard. Respiratory: Effort normal and breath sounds normal. She has no wheezes. She  has no rales.  GI: Bowel sounds are normal. She exhibits no distension. There is no tenderness.       gravid  Musculoskeletal: Normal range of motion. She exhibits no tenderness.       Trace pedal edema bilaterally  Skin: Skin is warm. No rash noted. No erythema.    MAU Course  Procedures NST   Assessment and Plan  1) Labor check: patient has made progress after walking for an hour.  Cervical exam changed from 4/60/-2 to 5/90/-2.  Will admit to L&D and monitor progress.  2) Reassuring FHR 3) GBS Positive: will start PCN. 4) Disposition: admit to labor and delivery  [name],[name] 07/09/2011, 8:38 PM

## 2011-07-09 NOTE — Progress Notes (Signed)
Contractions, denies bleeding or ROM.G3P2

## 2011-07-09 NOTE — Anesthesia Preprocedure Evaluation (Signed)

## 2011-07-09 NOTE — H&P (Signed)
Please see MAU note for complete H&P.   Please see MAU note for complete H&P.

## 2011-07-09 NOTE — Progress Notes (Signed)
Report called to [place], pt transported via wheelchair. Accompanied by E. Henley NT

## 2011-07-09 NOTE — Anesthesia Procedure Notes (Signed)
Epidural Patient location during procedure: OB Start time: 07/09/2011 11:47 PM  Staffing Anesthesiologist: Brayton Caves R Performed by: anesthesiologist   Preanesthetic Checklist Completed: patient identified, site marked, surgical consent, pre-op evaluation, timeout performed, IV checked, risks and benefits discussed and monitors and equipment checked  Epidural Patient position: sitting Prep: site prepped and draped and DuraPrep Patient monitoring: continuous pulse ox and blood pressure Approach: midline Injection technique: LOR air and LOR saline  Needle:  Needle type: Tuohy  Needle gauge: 17 G Needle length: 9 cm Needle insertion depth: 5 cm cm Catheter type: closed end flexible Catheter size: 19 Gauge Catheter at skin depth: 10 cm Test dose: negative  Assessment Events: blood not aspirated, injection not painful, no injection resistance, negative IV test and no paresthesia  Additional Notes Patient identified.  Risk benefits discussed including failed block, incomplete pain control, headache, nerve damage, paralysis, blood pressure changes, nausea, vomiting, reactions to medication both toxic or allergic, and postpartum back pain.  Patient expressed understanding and wished to proceed.  All questions were answered.  Sterile technique used throughout procedure and epidural site dressed with sterile barrier dressing. No paresthesia or other complications noted.The patient did not experience any signs of intravascular injection such as tinnitus or metallic taste in mouth nor signs of intrathecal spread such as rapid motor block. Please see nursing notes for vital signs.

## 2011-07-10 ENCOUNTER — Encounter (HOSPITAL_COMMUNITY): Payer: Self-pay | Admitting: Obstetrics

## 2011-07-10 LAB — RPR: RPR Ser Ql: NONREACTIVE

## 2011-07-10 MED ORDER — ZOLPIDEM TARTRATE 5 MG PO TABS: 5.0000 mg | ORAL_TABLET | Freq: Every evening | ORAL | Status: DC | PRN

## 2011-07-10 MED ORDER — SENNOSIDES-DOCUSATE SODIUM 8.6-50 MG PO TABS
2.0000 | ORAL_TABLET | Freq: Every day | ORAL | Status: DC
  Administered 2011-07-10 – 2011-07-11 (×2): 2 via ORAL

## 2011-07-10 MED ORDER — DIBUCAINE 1 % RE OINT: 1.0000 "application " | TOPICAL_OINTMENT | RECTAL | Status: DC | PRN

## 2011-07-10 MED ORDER — OXYCODONE-ACETAMINOPHEN 5-325 MG PO TABS: 1.0000 | ORAL_TABLET | ORAL | Status: DC | PRN

## 2011-07-10 MED ORDER — FLUTICASONE PROPIONATE 50 MCG/ACT NA SUSP
2.0000 | Freq: Every day | NASAL | Status: DC
  Filled 2011-07-10: qty 16

## 2011-07-10 MED ORDER — WITCH HAZEL-GLYCERIN EX PADS: 1.0000 "application " | MEDICATED_PAD | CUTANEOUS | Status: DC | PRN

## 2011-07-10 MED ORDER — TETANUS-DIPHTH-ACELL PERTUSSIS 5-2.5-18.5 LF-MCG/0.5 IM SUSP
0.5000 mL | Freq: Once | INTRAMUSCULAR | Status: AC
  Administered 2011-07-11: 0.5 mL via INTRAMUSCULAR
  Filled 2011-07-10: qty 0.5

## 2011-07-10 MED ORDER — SIMETHICONE 80 MG PO CHEW: 80.0000 mg | CHEWABLE_TABLET | ORAL | Status: DC | PRN

## 2011-07-10 MED ORDER — ONDANSETRON HCL 4 MG PO TABS: 4.0000 mg | ORAL_TABLET | ORAL | Status: DC | PRN

## 2011-07-10 MED ORDER — OXYTOCIN 20 UNITS IN LACTATED RINGERS INFUSION - SIMPLE
1.0000 m[IU]/min | INTRAVENOUS | Status: DC
  Administered 2011-07-10: 2 m[IU]/min via INTRAVENOUS
  Filled 2011-07-10: qty 1000

## 2011-07-10 MED ORDER — BENZOCAINE-MENTHOL 20-0.5 % EX AERO: 1.0000 "application " | INHALATION_SPRAY | CUTANEOUS | Status: DC | PRN

## 2011-07-10 MED ORDER — IBUPROFEN 600 MG PO TABS
600.0000 mg | ORAL_TABLET | Freq: Four times a day (QID) | ORAL | Status: DC
  Administered 2011-07-10 – 2011-07-12 (×7): 600 mg via ORAL
  Filled 2011-07-10 (×8): qty 1

## 2011-07-10 MED ORDER — TERBUTALINE SULFATE 1 MG/ML IJ SOLN: 0.2500 mg | Freq: Once | INTRAMUSCULAR | Status: DC | PRN

## 2011-07-10 MED ORDER — DIPHENHYDRAMINE HCL 25 MG PO CAPS
25.0000 mg | ORAL_CAPSULE | Freq: Four times a day (QID) | ORAL | Status: DC | PRN
  Administered 2011-07-10: 25 mg via ORAL
  Filled 2011-07-10: qty 1

## 2011-07-10 MED ORDER — PRENATAL PLUS 27-1 MG PO TABS
1.0000 | ORAL_TABLET | Freq: Every day | ORAL | Status: DC
  Administered 2011-07-10 – 2011-07-11 (×2): 1 via ORAL
  Filled 2011-07-10 (×3): qty 1

## 2011-07-10 MED ORDER — LANOLIN HYDROUS EX OINT: TOPICAL_OINTMENT | CUTANEOUS | Status: DC | PRN

## 2011-07-10 MED ORDER — ONDANSETRON HCL 4 MG/2ML IJ SOLN: 4.0000 mg | INTRAMUSCULAR | Status: DC | PRN

## 2011-07-10 NOTE — Progress Notes (Signed)
UR chart review completed.  

## 2011-07-10 NOTE — Anesthesia Postprocedure Evaluation (Signed)
  Anesthesia Post-op Note  Patient: Cheryl Weaver  Procedure(s) Performed: * No procedures listed *  Patient Location: PACU and Mother/Baby  Anesthesia Type: Bier block  Level of Consciousness: awake, alert  and oriented  Airway and Oxygen Therapy: Patient Spontanous Breathing  Post-op Pain: none  Post-op Assessment: Post-op Vital signs reviewed  Post-op Vital Signs: Reviewed and stable  Complications: No apparent anesthesia complications

## 2011-07-10 NOTE — Progress Notes (Addendum)
**Note Cheryl-Identified via Obfuscation** Cheryl Weaver is a 35 y.o. G3P2002 at [redacted]w[redacted]d by ultrasound admitted for active labor  Subjective: Patient resting comfortably.  No complaints.  Objective: BP 110/62  Pulse 80  Temp(Src) 99.5 F (37.5 C) (Axillary)  Resp 18  Ht 5\' 2"  (1.575 m)  Wt 86.183 kg (190 lb)  BMI 34.75 kg/m2  SpO2 100%  LMP 09/27/2010      FHT:  FHR: 160s bpm, variability: minimal ,  accelerations:  Present,  decelerations:  Absent UC:   regular, every 2-3 minutes SVE:   Dilation: 7 Effacement (%): 100 Station: 0 Exam by:: Cheryl Kells, RN  Labs: Lab Results  Component Value Date   WBC 6.7 07/09/2011   HGB 11.3* 07/09/2011   HCT 35.0* 07/09/2011   MCV 80.8 07/09/2011   PLT 176 07/09/2011    Assessment / Plan: Augmentation of labor, variables noted.  Labor: Will hold Pitocin for now, continue oxygen and IVF bolus Preeclampsia:  N/A Fetal Wellbeing:  Category II Pain Control:  Epidural I/D:  n/a Anticipated MOD:  NSVD  Cheryl Weaver 07/10/2011, 2:41 AM

## 2011-07-10 NOTE — Progress Notes (Signed)
Cheryl Weaver is a 35 y.o. G3P2002 at [redacted]w[redacted]d by ultrasound admitted for active labor  Subjective: Epidural placed.  No complaints.  Objective: BP 116/63  Pulse 89  Temp 98.6 F (37 C)  Resp 18  Ht 5\' 2"  (1.575 m)  Wt 86.183 kg (190 lb)  BMI 34.75 kg/m2  SpO2 100%  LMP 09/27/2010      FHT:  FHR: 160 bpm, variability: moderate,  accelerations:  Present,  decelerations:  Absent UC:   regular, every 5-6 minutes SVE:   Dilation: 5 Effacement (%): 90 Station: -2 Exam by:: Lucy Chris RNC  Labs: Lab Results  Component Value Date   WBC 6.7 07/09/2011   HGB 11.3* 07/09/2011   HCT 35.0* 07/09/2011   MCV 80.8 07/09/2011   PLT 176 07/09/2011    Assessment / Plan: Spontaneous labor, progressing normally  Labor: Progressing normally Preeclampsia:  N/A Fetal Wellbeing:  Category I Pain Control:  Epidural I/D:  n/a Anticipated MOD:  NSVD  [name],[name] 07/10/2011, 12:17 AM

## 2011-07-10 NOTE — Progress Notes (Signed)
[facility] Department of Clinical Social Work Documentation of Interpretation   I assisted _____Jessica  RN______________ with interpretation of _____teaching_________________ for this patient.

## 2011-07-10 NOTE — Plan of Care (Signed)
Problem: Consults Goal: Postpartum Patient Education (See Patient Education module for education specifics.)  Outcome: Progressing Done with inturpreter

## 2011-07-10 NOTE — Progress Notes (Addendum)
  Subjective: No complaints.  Feeling pressure with every ctx.  Objective: BP 105/72  Pulse 94  Temp(Src) 99.5 F (37.5 C) (Axillary)  Resp 18  Ht 5\' 2"  (1.575 m)  Wt 86.183 kg (190 lb)  BMI 34.75 kg/m2  SpO2 100%  LMP 09/27/2010      FHT:  FHR: 140s bpm, variability: moderate,  accelerations:  Present,  decelerations:  Absent UC:   regular, every 2-3 minutes SVE:   Dilation: 10 Effacement (%): 100 Station: +1 Exam by:: [name], DO  Labs: Lab Results  Component Value Date   WBC 6.7 07/09/2011   HGB 11.3* 07/09/2011   HCT 35.0* 07/09/2011   MCV 80.8 07/09/2011   PLT 176 07/09/2011    Assessment / Plan: Spontaneous labor, progressing normally  Labor: Progressing normally Preeclampsia:  NA Fetal Wellbeing:  Category II Pain Control:  Epidural I/D:  n/a Anticipated MOD:  NSVD  [name],Leean Amezcua 07/10/2011, 3:55 AM

## 2011-07-10 NOTE — Progress Notes (Signed)
Pt. Is requesting Sudafed for stuffy nose and dry cough.  Reported to oncoming RN at [phone], was asked to enter note.

## 2011-07-11 ENCOUNTER — Inpatient Hospital Stay (HOSPITAL_COMMUNITY): Admission: RE | Admit: 2011-07-11 | Payer: Self-pay | Source: Ambulatory Visit

## 2011-07-11 MED ORDER — DOCUSATE SODIUM 100 MG PO CAPS
100.0000 mg | ORAL_CAPSULE | Freq: Two times a day (BID) | ORAL | Status: DC
  Administered 2011-07-11 (×2): 100 mg via ORAL
  Filled 2011-07-11 (×3): qty 1

## 2011-07-11 NOTE — Progress Notes (Signed)
Post Partum Day #1  Subjective: up ad lib, voiding, tolerating PO and no BM or flatus yet.  Complains of diffuse abdominal pain.  Objective: Blood pressure 113/74, pulse 76, temperature 97.5 F (36.4 C), temperature source Oral, resp. rate 18, height 5\' 2"  (1.575 m), weight 86.183 kg (190 lb), last menstrual period 09/27/2010, SpO2 96.00%, unknown if currently breastfeeding.  Physical Exam:  General: alert, cooperative and no distress Lochia: appropriate Uterine Fundus: firm Incision: N/A DVT Evaluation: No cords or calf tenderness. No significant calf/ankle edema.   Basename 07/09/11 2300  HGB 11.3*  HCT 35.0*    Assessment/Plan: Plan for discharge tomorrow Add Colace to bowel regimen Breastfeeding and bottle, Lactation consult Desires IUD in 6 weeks Continue routine post-partum care   LOS: 2 days   [name],[name] 07/11/2011, 7:30 AM

## 2011-07-12 DIAGNOSIS — Z3490 Encounter for supervision of normal pregnancy, unspecified, unspecified trimester: Secondary | ICD-10-CM

## 2011-07-12 MED ORDER — DSS 100 MG PO CAPS: 100.0000 mg | ORAL_CAPSULE | Freq: Two times a day (BID) | ORAL | Status: AC

## 2011-07-12 MED ORDER — IBUPROFEN 600 MG PO TABS: 600.0000 mg | ORAL_TABLET | Freq: Four times a day (QID) | ORAL | Status: AC

## 2011-07-12 MED ORDER — SENNOSIDES-DOCUSATE SODIUM 8.6-50 MG PO TABS: 2.0000 | ORAL_TABLET | Freq: Every day | ORAL | Status: DC

## 2011-07-12 MED ORDER — DSS 100 MG PO CAPS: 100.0000 mg | ORAL_CAPSULE | Freq: Two times a day (BID) | ORAL | Status: DC

## 2011-07-12 MED ORDER — SENNOSIDES-DOCUSATE SODIUM 8.6-50 MG PO TABS: 2.0000 | ORAL_TABLET | Freq: Every day | ORAL | Status: AC

## 2011-07-12 NOTE — Discharge Summary (Signed)
**Note Cheryl-Identified via Obfuscation** Obstetric Discharge Summary Reason for Admission: onset of labor Prenatal Procedures: NST Intrapartum Procedures: spontaneous vaginal delivery Postpartum Procedures: none Complications-Operative and Postpartum: none Hemoglobin  Date Value Range Status  07/09/2011 11.3* 12.0-15.0 (g/dL) Final     HCT  Date Value Range Status  07/09/2011 35.0* 36.0-46.0 (%) Final    Discharge Diagnoses: Term Pregnancy-delivered  Delivery Note  At 4:30 AM a viable female was delivered via Vaginal, Spontaneous Delivery (Presentation: Right Occiput Anterior). APGAR: 9, 9; weight 7 lb 6 oz (3345 g).   Placenta status: Intact, Spontaneous. Cord: 3 vessels with the following complications: None.  Anesthesia: Epidural  Episiotomy: None  Lacerations: 1st degree  Suture Repair: 3.0 vicryl  Est. Blood Loss (mL): < 250 cc   Mom to postpartum. Baby to nursery-stable.   Plans to breast and bottle feed.  Birth control - IUD in 6 weeks.   Discharge Information: Date: 07/12/2011 Activity: pelvic rest for 6 weeks Diet: routine Medications: PNV, Ibuprofen and Colace Condition: stable Instructions: refer to practice specific booklet Discharge to: home Follow-up Information    Follow up with Cheryl Weaver,Cheryl Weaver. Make an appointment in 6 weeks. (post partum check up)    Contact information:   [address] [place] [address] [phone]          Newborn Data: Live born female  Birth Weight: 7 lb 6 oz (3345 g) APGAR: 9, 9  Home with mother.  Cheryl Weaver,Cheryl Weaver 07/12/2011, 7:43 AM

## 2011-07-27 NOTE — H&P (Signed)
I have seen and examined this patient and I agree with the above. Cam Hai 5:33 PM 07/27/2011

## 2011-08-28 ENCOUNTER — Ambulatory Visit: Payer: Self-pay | Admitting: Family Medicine

## 2011-09-03 ENCOUNTER — Encounter: Payer: Self-pay | Admitting: Family Medicine

## 2011-09-03 ENCOUNTER — Ambulatory Visit (INDEPENDENT_AMBULATORY_CARE_PROVIDER_SITE_OTHER): Payer: Self-pay | Admitting: Family Medicine

## 2011-09-03 VITALS — BP 112/75 | HR 102 | Temp 98.2°F | Wt 172.8 lb

## 2011-09-03 DIAGNOSIS — Z01419 Encounter for gynecological examination (general) (routine) without abnormal findings: Secondary | ICD-10-CM

## 2011-09-03 NOTE — Progress Notes (Signed)
Due to language barrier, an interpreter was present during the history-taking and subsequent discussion (and for part of the physical exam) with this patient. Interpreter Amma Crear Namihira 14.30 [name] de la Sondra Come 09/03/2011

## 2011-09-03 NOTE — Progress Notes (Signed)
  To labor and Subjective:     Cheryl Weaver is a 36 y.o. female who presents for a postpartum visit. She is 6 weeks postpartum following a spontaneous vaginal delivery. I have fully reviewed the prenatal and intrapartum course. The delivery was at term. Outcome: spontaneous vaginal delivery. Anesthesia: epidural. Postpartum course has been normal.  Baby is feeding by bottle - [employer] Good Start. Bleeding no bleeding. Bowel function is normal. Bladder function is abnormal: still has incontinence when she sneezes.. Patient is sexually active. Contraception method is condoms.  She desires IUD.  Postpartum depression screening: negative.and  The following portions of the patient's history were reviewed and updated as appropriate: allergies, current medications, past medical history and problem list.  Review of Systems Pertinent items are noted in HPI.   Objective:    BP 112/75  Pulse 102  Temp(Src) 98.2 F (36.8 C) (Oral)  Wt 172 lb 12.8 oz (78.382 kg)  General:  alert, cooperative and no distress     Lungs: clear to auscultation bilaterally  Heart:  regular rate and rhythm, S1, S2 normal, no murmur, click, rub or gallop  Abdomen: soft, non-tender; bowel sounds normal; no masses,  no organomegaly   Vulva:  normal  Vagina: normal vagina  Cervix:  normal     Adnexa:  no mass, fullness, tenderness  Rectal Exam: Not performed.        Assessment:     6 week postpartum exam. Pap smear not done at today's visit.   Plan:     1. Contraception: condoms, patient is applying for [facility] scholarship for IUD 2. No evidence of post-partum depression.  3. Follow up in: a few weeks for [facility] at [facility] or as needed.

## 2011-10-22 ENCOUNTER — Telehealth: Payer: Self-pay | Admitting: Family Medicine

## 2011-10-22 NOTE — Telephone Encounter (Signed)
Andreas Blower, I have not heard from [facility] yet.  I will check my box again this week and let you know if anything pops up.

## 2011-10-23 NOTE — Telephone Encounter (Signed)
I called [facility] to follow up.  They did receive it and she was denied due to her income being too much. Cheryl Weaver, [name]

## 2012-07-29 NOTE — L&D Delivery Note (Addendum)
Delivery Note At 3:57 AM a viable female was delivered via Vaginal, Spontaneous Delivery (Presentation: Left Occiput Anterior).  APGAR: 9, 9; weight .   Placenta status: , Spontaneous.  Cord:  with the following complications: None.  Cord pH: none  Anesthesia: Epidural  Episiotomy: None Lacerations: 1st degree;Perineal Suture Repair: 3.0 chromic Est. Blood Loss (mL): 350  Mom to postpartum.  Baby to nursery-stable.  Tom Ragsdale A 05/07/2013, 4:37 AM

## 2013-05-06 ENCOUNTER — Inpatient Hospital Stay (HOSPITAL_COMMUNITY): Payer: Medicaid Other

## 2013-05-06 ENCOUNTER — Inpatient Hospital Stay (HOSPITAL_COMMUNITY): Payer: Medicaid Other | Admitting: Anesthesiology

## 2013-05-06 ENCOUNTER — Encounter (HOSPITAL_COMMUNITY): Payer: Medicaid Other | Admitting: Anesthesiology

## 2013-05-06 ENCOUNTER — Encounter (HOSPITAL_COMMUNITY): Payer: Self-pay | Admitting: *Deleted

## 2013-05-06 ENCOUNTER — Inpatient Hospital Stay (HOSPITAL_COMMUNITY)
Admission: AD | Admit: 2013-05-06 | Discharge: 2013-05-08 | DRG: 775 | Disposition: A | Discharge status: Home / Self Care | Payer: Medicaid Other | Source: Ambulatory Visit | Attending: Obstetrics | Admitting: Obstetrics

## 2013-05-06 DIAGNOSIS — O09529 Supervision of elderly multigravida, unspecified trimester: Secondary | ICD-10-CM | POA: Diagnosis present

## 2013-05-06 HISTORY — DX: Other specified health status: Z78.9

## 2013-05-06 LAB — CBC
HCT: 34.4 % — ABNORMAL LOW (ref 36.0–46.0)
MCH: 26.5 pg (ref 26.0–34.0)
MCHC: 32.6 g/dL (ref 30.0–36.0)
MCV: 81.3 fL (ref 78.0–100.0)
Platelets: 191 10*3/uL (ref 150–400)
RDW: 17.1 % — ABNORMAL HIGH (ref 11.5–15.5)
WBC: 9.7 10*3/uL (ref 4.0–10.5)

## 2013-05-06 MED ORDER — PHENYLEPHRINE 40 MCG/ML (10ML) SYRINGE FOR IV PUSH (FOR BLOOD PRESSURE SUPPORT)
80.0000 ug | PREFILLED_SYRINGE | INTRAVENOUS | Status: DC | PRN
  Filled 2013-05-06: qty 5
  Filled 2013-05-06: qty 2

## 2013-05-06 MED ORDER — OXYTOCIN BOLUS FROM INFUSION
500.0000 mL | INTRAVENOUS | Status: DC
  Administered 2013-05-07: 500 mL via INTRAVENOUS

## 2013-05-06 MED ORDER — IBUPROFEN 600 MG PO TABS: 600.0000 mg | ORAL_TABLET | Freq: Four times a day (QID) | ORAL | Status: DC | PRN

## 2013-05-06 MED ORDER — LIDOCAINE HCL (PF) 1 % IJ SOLN
30.0000 mL | INTRAMUSCULAR | Status: DC | PRN
  Filled 2013-05-06 (×2): qty 30

## 2013-05-06 MED ORDER — LACTATED RINGERS IV SOLN
INTRAVENOUS | Status: DC
  Administered 2013-05-06: 22:00:00 via INTRAVENOUS

## 2013-05-06 MED ORDER — LACTATED RINGERS IV SOLN: 500.0000 mL | INTRAVENOUS | Status: DC | PRN

## 2013-05-06 MED ORDER — ONDANSETRON HCL 4 MG/2ML IJ SOLN: 4.0000 mg | Freq: Four times a day (QID) | INTRAMUSCULAR | Status: DC | PRN

## 2013-05-06 MED ORDER — DEXTROSE 5 % IV SOLN
5.0000 10*6.[IU] | Freq: Once | INTRAVENOUS | Status: AC
  Administered 2013-05-06: 5 10*6.[IU] via INTRAVENOUS
  Filled 2013-05-06: qty 5

## 2013-05-06 MED ORDER — PENICILLIN G POTASSIUM 5000000 UNITS IJ SOLR
2.5000 10*6.[IU] | INTRAVENOUS | Status: DC
  Administered 2013-05-07: 2.5 10*6.[IU] via INTRAVENOUS
  Filled 2013-05-06 (×4): qty 2.5

## 2013-05-06 MED ORDER — LIDOCAINE HCL (PF) 1 % IJ SOLN
INTRAMUSCULAR | Status: DC | PRN
  Administered 2013-05-06 (×2): 5 mL

## 2013-05-06 MED ORDER — OXYCODONE-ACETAMINOPHEN 5-325 MG PO TABS: 1.0000 | ORAL_TABLET | ORAL | Status: DC | PRN

## 2013-05-06 MED ORDER — EPHEDRINE 5 MG/ML INJ
10.0000 mg | INTRAVENOUS | Status: DC | PRN
  Filled 2013-05-06: qty 2

## 2013-05-06 MED ORDER — LACTATED RINGERS IV SOLN: 500.0000 mL | Freq: Once | INTRAVENOUS | Status: DC

## 2013-05-06 MED ORDER — FLEET ENEMA 7-19 GM/118ML RE ENEM: 1.0000 | ENEMA | RECTAL | Status: DC | PRN

## 2013-05-06 MED ORDER — DIPHENHYDRAMINE HCL 50 MG/ML IJ SOLN: 12.5000 mg | INTRAMUSCULAR | Status: DC | PRN

## 2013-05-06 MED ORDER — FENTANYL 2.5 MCG/ML BUPIVACAINE 1/10 % EPIDURAL INFUSION (WH - ANES)
14.0000 mL/h | INTRAMUSCULAR | Status: DC | PRN
  Administered 2013-05-06: 14 mL/h via EPIDURAL
  Filled 2013-05-06: qty 125

## 2013-05-06 MED ORDER — ACETAMINOPHEN 325 MG PO TABS: 650.0000 mg | ORAL_TABLET | ORAL | Status: DC | PRN

## 2013-05-06 MED ORDER — EPHEDRINE 5 MG/ML INJ
10.0000 mg | INTRAVENOUS | Status: DC | PRN
  Filled 2013-05-06: qty 2
  Filled 2013-05-06: qty 4

## 2013-05-06 MED ORDER — OXYTOCIN 40 UNITS IN LACTATED RINGERS INFUSION - SIMPLE MED
62.5000 mL/h | INTRAVENOUS | Status: DC
  Filled 2013-05-06: qty 1000

## 2013-05-06 MED ORDER — CITRIC ACID-SODIUM CITRATE 334-500 MG/5ML PO SOLN: 30.0000 mL | ORAL | Status: DC | PRN

## 2013-05-06 MED ORDER — PHENYLEPHRINE 40 MCG/ML (10ML) SYRINGE FOR IV PUSH (FOR BLOOD PRESSURE SUPPORT)
80.0000 ug | PREFILLED_SYRINGE | INTRAVENOUS | Status: DC | PRN
  Filled 2013-05-06: qty 2

## 2013-05-06 NOTE — Anesthesia Preprocedure Evaluation (Signed)

## 2013-05-06 NOTE — H&P (Signed)
Cheryl Weaver is a 37 y.o. female presenting for UC's. Maternal Medical History:  Reason for admission: Contractions.  37 yo G4 P3.  EDC 05-11-13.  Presents with UC's    Fetal activity: Perceived fetal activity is normal.   Last perceived fetal movement was within the past hour.    Prenatal Complications - Diabetes: none.    OB History   Grav Para Term Preterm Abortions TAB SAB Ect Mult Living   4 3 3  0 0 0 0 0 0 3     Past Medical History  Diagnosis Date  . No pertinent past medical history   . Influenza   . Medical history non-contributory    Past Surgical History  Procedure Laterality Date  . No past surgeries     Family History: family history includes Cancer in her mother; Diabetes in her father. Social History:  reports that she has never smoked. She has never used smokeless tobacco. She reports that she does not drink alcohol or use illicit drugs.   Prenatal Transfer Tool  Maternal Diabetes: No Genetic Screening: Declined Maternal Ultrasounds/Referrals: Normal Fetal Ultrasounds or other Referrals:  None Maternal Substance Abuse:  No Significant Maternal Medications:  None Significant Maternal Lab Results:  None Other Comments:  None  Review of Systems  All other systems reviewed and are negative.    Dilation: 4 Effacement (%): 80 Station: -2 Exam by:: B.Bethea RN Blood pressure 123/65, pulse 85, temperature 97.9 F (36.6 C), temperature source Oral, resp. rate 20, height 5\' 3"  (1.6 m), weight 181 lb (82.101 kg). Maternal Exam:  Abdomen: Patient reports no abdominal tenderness. Fetal presentation: vertex  Introitus: Normal vulva. Normal vagina.  Pelvis: adequate for delivery.   Cervix: Cervix evaluated by digital exam.     Physical Exam  Nursing note and vitals reviewed. Constitutional: She is oriented to person, place, and time. She appears well-developed and well-nourished.  HENT:  Head: Normocephalic and atraumatic.  Eyes: Conjunctivae  are normal. Pupils are equal, round, and reactive to light.  Neck: Normal range of motion. Neck supple.  Cardiovascular: Normal rate and regular rhythm.   Respiratory: Effort normal and breath sounds normal.  GI: Soft.  Genitourinary: Vagina normal and uterus normal.  Musculoskeletal: Normal range of motion.  Neurological: She is alert and oriented to person, place, and time.  Skin: Skin is warm and dry.  Psychiatric: She has a normal mood and affect. Her behavior is normal. Judgment and thought content normal.    Prenatal labs: ABO, Rh:   Antibody:   Rubella:   RPR:    HBsAg:    HIV:    GBS:     Assessment/Plan: 39.2 weeks.  Early labor.  Admit.   Kalden Wanke A 05/06/2013, 10:49 PM

## 2013-05-06 NOTE — MAU Note (Signed)
Pt states she has been having contractions since 0700 this morning. Pt denies SROM and or vaginal bleeding

## 2013-05-06 NOTE — MAU Note (Signed)
Contractions started this morning. No leaking or bleeding.  Was 1+ on Tues.

## 2013-05-06 NOTE — Anesthesia Procedure Notes (Signed)
Epidural Patient location during procedure: OB Start time: 05/06/2013 11:14 PM  Staffing Anesthesiologist: Angus Seller., Harrell Gave. Performed by: anesthesiologist   Preanesthetic Checklist Completed: patient identified, site marked, surgical consent, pre-op evaluation, timeout performed, IV checked, risks and benefits discussed and monitors and equipment checked  Epidural Patient position: sitting Prep: site prepped and draped and DuraPrep Patient monitoring: continuous pulse ox and blood pressure Approach: midline Injection technique: LOR air  Needle:  Needle type: Tuohy  Needle gauge: 17 G Needle length: 9 cm and 9 Needle insertion depth: 5 cm cm Catheter type: closed end flexible Catheter size: 19 Gauge Catheter at skin depth: 10 cm Test dose: negative  Assessment Events: blood not aspirated, injection not painful, no injection resistance, negative IV test and no paresthesia  Additional Notes Patient identified.  Risk benefits discussed including failed block, incomplete pain control, headache, nerve damage, paralysis, blood pressure changes, nausea, vomiting, reactions to medication both toxic or allergic, and postpartum back pain.  Patient expressed understanding and wished to proceed.  All questions were answered.  Sterile technique used throughout procedure and epidural site dressed with sterile barrier dressing. No paresthesia or other complications noted.The patient did not experience any signs of intravascular injection such as tinnitus or metallic taste in mouth nor signs of intrathecal spread such as rapid motor block. Please see nursing notes for vital signs.

## 2013-05-07 ENCOUNTER — Encounter (HOSPITAL_COMMUNITY): Payer: Self-pay | Admitting: *Deleted

## 2013-05-07 LAB — TYPE AND SCREEN
ABO/RH(D): A POS
Antibody Screen: NEGATIVE

## 2013-05-07 LAB — RAPID HIV SCREEN (WH-MAU): Rapid HIV Screen: NONREACTIVE

## 2013-05-07 LAB — CBC
MCH: 26 pg (ref 26.0–34.0)
MCV: 79.4 fL (ref 78.0–100.0)
Platelets: 173 10*3/uL (ref 150–400)
RDW: 16.6 % — ABNORMAL HIGH (ref 11.5–15.5)
WBC: 12.4 10*3/uL — ABNORMAL HIGH (ref 4.0–10.5)

## 2013-05-07 LAB — ABO/RH: ABO/RH(D): A POS

## 2013-05-07 MED ORDER — MEDROXYPROGESTERONE ACETATE 150 MG/ML IM SUSP: 150.0000 mg | INTRAMUSCULAR | Status: DC | PRN

## 2013-05-07 MED ORDER — IBUPROFEN 600 MG PO TABS
600.0000 mg | ORAL_TABLET | Freq: Four times a day (QID) | ORAL | Status: DC
  Administered 2013-05-07 – 2013-05-08 (×5): 600 mg via ORAL
  Filled 2013-05-07 (×7): qty 1

## 2013-05-07 MED ORDER — SIMETHICONE 80 MG PO CHEW: 80.0000 mg | CHEWABLE_TABLET | ORAL | Status: DC | PRN

## 2013-05-07 MED ORDER — PRENATAL MULTIVITAMIN CH
1.0000 | ORAL_TABLET | Freq: Every day | ORAL | Status: DC
  Administered 2013-05-08: 1 via ORAL
  Filled 2013-05-07 (×2): qty 1

## 2013-05-07 MED ORDER — ONDANSETRON HCL 4 MG PO TABS: 4.0000 mg | ORAL_TABLET | ORAL | Status: DC | PRN

## 2013-05-07 MED ORDER — OXYTOCIN 40 UNITS IN LACTATED RINGERS INFUSION - SIMPLE MED: 62.5000 mL/h | INTRAVENOUS | Status: DC | PRN

## 2013-05-07 MED ORDER — LANOLIN HYDROUS EX OINT: TOPICAL_OINTMENT | CUTANEOUS | Status: DC | PRN

## 2013-05-07 MED ORDER — TETANUS-DIPHTH-ACELL PERTUSSIS 5-2.5-18.5 LF-MCG/0.5 IM SUSP: 0.5000 mL | Freq: Once | INTRAMUSCULAR | Status: DC

## 2013-05-07 MED ORDER — WITCH HAZEL-GLYCERIN EX PADS: 1.0000 "application " | MEDICATED_PAD | CUTANEOUS | Status: DC | PRN

## 2013-05-07 MED ORDER — ONDANSETRON HCL 4 MG/2ML IJ SOLN: 4.0000 mg | INTRAMUSCULAR | Status: DC | PRN

## 2013-05-07 MED ORDER — OXYCODONE-ACETAMINOPHEN 5-325 MG PO TABS: 1.0000 | ORAL_TABLET | ORAL | Status: DC | PRN

## 2013-05-07 MED ORDER — INFLUENZA VAC SPLIT QUAD 0.5 ML IM SUSP
0.5000 mL | INTRAMUSCULAR | Status: AC
  Administered 2013-05-07: 0.5 mL via INTRAMUSCULAR
  Filled 2013-05-07: qty 0.5

## 2013-05-07 MED ORDER — DIBUCAINE 1 % RE OINT: 1.0000 "application " | TOPICAL_OINTMENT | RECTAL | Status: DC | PRN

## 2013-05-07 MED ORDER — ZOLPIDEM TARTRATE 5 MG PO TABS: 5.0000 mg | ORAL_TABLET | Freq: Every evening | ORAL | Status: DC | PRN

## 2013-05-07 MED ORDER — DIPHENHYDRAMINE HCL 25 MG PO CAPS: 25.0000 mg | ORAL_CAPSULE | Freq: Four times a day (QID) | ORAL | Status: DC | PRN

## 2013-05-07 MED ORDER — BENZOCAINE-MENTHOL 20-0.5 % EX AERO: 1.0000 "application " | INHALATION_SPRAY | CUTANEOUS | Status: DC | PRN

## 2013-05-07 MED ORDER — SENNOSIDES-DOCUSATE SODIUM 8.6-50 MG PO TABS
2.0000 | ORAL_TABLET | ORAL | Status: DC
  Administered 2013-05-08: 2 via ORAL
  Filled 2013-05-07: qty 2

## 2013-05-07 NOTE — Progress Notes (Signed)
Called L&D and spoke to [place], [place] regarding no Prenatal records showing lab results with HIV, Hep B.  Entered order per protocol for Hep B & Rapid HIV to be run off of blood that lab already collected since records unavailable for infant admission.

## 2013-05-07 NOTE — Lactation Note (Signed)
This note was copied from the chart of Cheryl Weaver. Lactation Consultation Note  Patient Name: Cheryl Weaver [id number] Date: 05/07/2013 Reason for consult: Initial assessment of this multipara with previous breastfeeding experience.  She has 3 other children and states (in [place]) that she breastfed her other children for 18 months each.  She also informs LC that she knows how to hand express colostrum and has seen drops.  She has fed several times since birth and denies any latching problems or breast or nipple pain.     Maternal Data Formula Feeding for Exclusion: No Infant to breast within first hour of birth: Yes (initial LATCH score=10) Has patient been taught Hand Expression?: Yes Does the patient have breastfeeding experience prior to this delivery?: Yes  Feeding Feeding Type: Breast Fed Length of feed: 30 min  LATCH Score/Interventions         LATCH score =8/10             Lactation Tools Discussed/Used   STS, cue feedings, hand expression  Consult Status Consult Status: Follow-up Date: 05/08/13 Follow-up type: In-patient    Cheryl Weaver [facility] 05/07/2013, 10:36 PM

## 2013-05-07 NOTE — Progress Notes (Signed)
Patient ID: Cheryl Weaver, female   DOB: 09-06-1975, 37 y.o.   MRN: [id number] Postpartum day 0 Vital signs normal fundus firm Lochia moderate Legs negative doing well and

## 2013-05-07 NOTE — Progress Notes (Signed)
UR completed 

## 2013-05-07 NOTE — Anesthesia Postprocedure Evaluation (Signed)
  Anesthesia Post-op Note  Patient: Cheryl Weaver  Procedure(s) Performed: * No procedures listed *  Patient Location: Mother/Baby  Anesthesia Type:Epidural  Level of Consciousness: awake, alert , oriented and patient cooperative  Airway and Oxygen Therapy: Patient Spontanous Breathing  Post-op Pain: mild  Post-op Assessment: Patient's Cardiovascular Status Stable and Respiratory Function Stable  Post-op Vital Signs: stable  Complications: No apparent anesthesia complications

## 2013-05-08 NOTE — Discharge Summary (Signed)
Obstetric Discharge Summary Reason for Admission: onset of labor Prenatal Procedures: none Intrapartum Procedures: spontaneous vaginal delivery Postpartum Procedures: none Complications-Operative and Postpartum: none Hemoglobin  Date Value Range Status  05/07/2013 11.0* 12.0 - 15.0 g/dL Final     HCT  Date Value Range Status  05/07/2013 33.6* 36.0 - 46.0 % Final    Physical Exam:  General: alert Lochia: appropriate Uterine Fundus: firm Incision: healing well DVT Evaluation: No evidence of DVT seen on physical exam.  Discharge Diagnoses: Term Pregnancy-delivered  Discharge Information: Date: 05/08/2013 Activity: unrestricted Diet: routine Medications: Percocet Condition: stable Instructions: refer to practice specific booklet Discharge to: home Follow-up Information   Follow up with MARSHALL,BERNARD A, MD In 6 weeks.   Specialty:  Obstetrics and Gynecology   Contact information:   [address] [place] [address] [phone]       Newborn Data: Live born female  Birth Weight: 5 lb 15.6 oz (2710 g) APGAR: 9, 9  Home with mother.  MARSHALL,BERNARD A 05/08/2013, 7:28 AM

## 2014-02-08 LAB — PROCEDURE REPORT - SCANNED: PAP SMEAR: NEGATIVE

## 2014-05-30 ENCOUNTER — Encounter (HOSPITAL_COMMUNITY): Payer: Self-pay | Admitting: *Deleted

## 2014-11-02 ENCOUNTER — Encounter (HOSPITAL_COMMUNITY): Payer: Self-pay | Admitting: Emergency Medicine

## 2014-11-02 ENCOUNTER — Inpatient Hospital Stay (HOSPITAL_COMMUNITY)
Admission: AD | Admit: 2014-11-02 | Discharge: 2014-11-02 | Disposition: A | Discharge status: Home / Self Care | Payer: Medicaid Other | Source: Ambulatory Visit | Attending: Obstetrics | Admitting: Obstetrics

## 2014-11-02 ENCOUNTER — Inpatient Hospital Stay (HOSPITAL_COMMUNITY): Payer: No Typology Code available for payment source

## 2014-11-02 DIAGNOSIS — O209 Hemorrhage in early pregnancy, unspecified: Secondary | ICD-10-CM

## 2014-11-02 DIAGNOSIS — O021 Missed abortion: Secondary | ICD-10-CM | POA: Diagnosis not present

## 2014-11-02 LAB — POCT PREGNANCY, URINE: Preg Test, Ur: POSITIVE — AB

## 2014-11-02 LAB — HCG, QUANTITATIVE, PREGNANCY: HCG, BETA CHAIN, QUANT, S: 3707 m[IU]/mL — AB (ref <5)

## 2014-11-02 LAB — WET PREP, GENITAL
CLUE CELLS WET PREP: NONE SEEN
TRICH WET PREP: NONE SEEN
Yeast Wet Prep HPF POC: NONE SEEN

## 2014-11-02 LAB — CBC
HEMATOCRIT: 35.8 % — AB (ref 36.0–46.0)
Hemoglobin: 12 g/dL (ref 12.0–15.0)
MCH: 27.9 pg (ref 26.0–34.0)
MCHC: 33.5 g/dL (ref 30.0–36.0)
MCV: 83.3 fL (ref 78.0–100.0)
Platelets: 203 10*3/uL (ref 150–400)
RBC: 4.3 MIL/uL (ref 3.87–5.11)
RDW: 13.9 % (ref 11.5–15.5)
WBC: 7 10*3/uL (ref 4.0–10.5)

## 2014-11-02 LAB — URINALYSIS, ROUTINE W REFLEX MICROSCOPIC
Bilirubin Urine: NEGATIVE
GLUCOSE, UA: NEGATIVE mg/dL
Ketones, ur: NEGATIVE mg/dL
LEUKOCYTES UA: NEGATIVE
Nitrite: NEGATIVE
Protein, ur: NEGATIVE mg/dL
Specific Gravity, Urine: 1.025 (ref 1.005–1.030)
Urobilinogen, UA: 0.2 mg/dL (ref 0.0–1.0)
pH: 6 (ref 5.0–8.0)

## 2014-11-02 LAB — URINE MICROSCOPIC-ADD ON

## 2014-11-02 MED ORDER — MISOPROSTOL 200 MCG PO TABS
800.0000 ug | ORAL_TABLET | Freq: Once | ORAL | Status: AC
  Administered 2014-11-02: 800 ug via VAGINAL
  Filled 2014-11-02: qty 4

## 2014-11-02 MED ORDER — HYDROCODONE-ACETAMINOPHEN 5-325 MG PO TABS: 1.0000 | ORAL_TABLET | ORAL | Status: DC | PRN

## 2014-11-02 MED ORDER — PROMETHAZINE HCL 25 MG PO TABS: 12.5000 mg | ORAL_TABLET | Freq: Four times a day (QID) | ORAL | Status: DC | PRN

## 2014-11-02 NOTE — MAU Note (Signed)
Last month had ongoing spotting.  Today at around 5, started bleeding like a period.  Passed a quarter sized clot. No pain. Last month was having back pain and what felt like contractions.  No pain today.

## 2014-11-02 NOTE — Discharge Instructions (Signed)
Aborto espontáneo  °(Miscarriage) °El aborto espontáneo es la pérdida de un bebé que no ha nacido (feto) antes de la semana 20 del embarazo. La mayor parte de estos abortos ocurre en los primeros 3 meses. En algunos casos ocurre antes de que la mujer sepa que está embarazada. También se denomina "aborto espontáneo" o "pérdida prematura del embarazo". El aborto espontáneo puede ser una experiencia que afecte emocionalmente a la persona. Converse con su médico si tiene dudas, cómo es el proceso de duelo, y sobre planes futuros de embarazo.  °CAUSAS  °· Algunos problemas cromosómicos pueden hacer imposible que el bebé se desarrolle normalmente. Los problemas con los genes o cromosomas del bebé son generalmente el resultado de errores que se producen, por casualidad, cuando el embrión se divide y crece. Estos problemas no se heredan de los padres. °· Infección en el cuello del útero.   °· Problemas hormonales.   °· Problemas en el cuello del útero, como tener un útero incompetente. Esto ocurre cuando los tejidos no son lo suficientemente fuertes como para contener el embarazo.   °· Problemas del útero, como un útero con forma anormal, los fibromas o anormalidades congénitas.   °· Ciertas enfermedades crónicas.   °· No fume, no beba alcohol, ni consuma drogas.   °· Traumatismos   °A veces, la causa es desconocida.  °SÍNTOMAS  °· Sangrado o manchado vaginal, con o sin cólicos o dolor. °· Dolor o cólicos en el abdomen o en la cintura. °· Eliminación de líquido, tejidos o coágulos grandes por la vagina. °DIAGNÓSTICO  °El médico le hará un examen físico. También le indicará una ecografía para confirmar el aborto. Es posible que se realicen análisis de sangre.  °TRATAMIENTO  °· En algunos casos el tratamiento no es necesario, si se eliminan naturalmente todos los tejidos embrionarios que se encontraban en el útero. Si el feto o la placenta quedan dentro del útero (aborto incompleto), pueden infectarse, los tejidos que quedan  pueden infectarse y deben retirarse. Generalmente se realiza un procedimiento de dilatación y curetaje (D y C). Durante el procedimiento de dilatación y curetaje, el cuello del útero se abre (dilata) y se retira cualquier resto de tejido fetal o placentario del útero. °· Si hay una infección, le recetarán antibióticos. Podrán recetarle otros medicamentos para reducir el tamaño del útero (contraerlo) si hay una mucho sangrado. °· Si su sangre es Rh negativa y su bebé es Rh positivo, usted necesitará la inyección de inmunoglobulina Rh. Esta inyección protegerá a los futuros bebés de tener problemas de compatibilidad Rh en futuros embarazos. °INSTRUCCIONES PARA EL CUIDADO EN EL HOGAR  °· El médico le indicará reposo en cama o le permitirá realizar actividades livianas. Vuelva a la actividad lentamente o según las indicaciones de su médico. °· Pídale a alguien que la ayude con las responsabilidades familiares y del hogar durante este tiempo.   °· Lleve un registro de la cantidad y la saturación de las toallas higiénicas que utiliza cada día. Anote esta información   °· No use tampones. No No se haga duchas vaginales ni tenga relaciones sexuales hasta que el médico la autorice.   °· Sólo tome medicamentos de venta libre o recetados para calmar el dolor o el malestar, según las indicaciones de su médico.   °· No tome aspirina. La aspirina puede ocasionar hemorragias.   °· Concurra puntualmente a las citas de control con el médico.   °· Si usted o su pareja tienen dificultades con el duelo, hable con su médico para buscar la ayuda psicológica que los ayude a enfrentar la pérdida   del embarazo. Permítase el tiempo suficiente de duelo antes de quedar embarazada nuevamente.   °SOLICITE ATENCIÓN MÉDICA DE INMEDIATO SI:  °· Siente calambres intensos o dolor en la espalda o en el abdomen. °· Tiene fiebre. °· Elimina grandes coágulos de sangre (del tamaño de una nuez o más) o tejidos por la vagina. Guarde lo que ha eliminado para  que su médico lo examine.   °· La hemorragia aumenta.   °· Observa una secreción vaginal espesa y con mal olor. °· Se siente mareada, débil, o se desmaya.   °· Siente escalofríos.   °ASEGÚRESE DE QUE:  °· Comprende estas instrucciones. °· Controlará su enfermedad. °· Solicitará ayuda de inmediato si no mejora o si empeora. °Document Released: 04/24/2005 Document Revised: 11/09/2012 °Exit[employer]® Patient Information ©2015 [employer]. This information is not intended to replace advice given to you by your health care provider. Make sure you discuss any questions you have with your health care provider. ° °

## 2014-11-02 NOTE — MAU Note (Addendum)
First appt with Dr Gaynell FaceMarshall for this preg is 04/16. preg confirmed at Dr Elsie Stainmarshall's office in March

## 2014-11-02 NOTE — MAU Note (Signed)
Pt. Urine in lab 

## 2014-11-02 NOTE — Progress Notes (Signed)
I assisted RN with some questions, by Viria Alvarez Spanish Interpreter °

## 2014-11-02 NOTE — MAU Provider Note (Signed)
History     CSN: [id number]  Arrival date and time: 11/02/14 1738   First Provider Initiated Contact with Patient 11/02/14 1854      Chief Complaint  Patient presents with  . Vaginal Bleeding   HPI Comments: [name] is a 39 y.o. female [redacted]w[redacted]d who presents with vaginal bleeding. The symptoms started today; the bleeding is described as heavy like a menstrual cycle. The bleeding is "normal red" with clots. Intercourse was 10 days ago. She is not having any pain. Nothing makes the bleeding worse; she has not been doing anything strenuous today.   LMP April 14th and patient is certain about this. She has not started prenatal care; she does have an appointment scheduled with Dr. Gaynell Weaver.     OB History    Gravida Para Term Preterm AB TAB SAB Ectopic Multiple Living   0 0 0 0 0 0 4      Past Medical History  Diagnosis Date  . No pertinent past medical history   . Influenza   . Medical history non-contributory     Past Surgical History  Procedure Laterality Date  . No past surgeries      Family History  Problem Relation Age of Onset  . Cancer Mother     cervical  . Diabetes Father     History  Substance Use Topics  . Smoking status: Never Smoker   . Smokeless tobacco: Never Used  . Alcohol Use: No    Allergies: No Known Allergies  Prescriptions prior to admission  Medication Sig Dispense Refill Last Dose  . acetaminophen (TYLENOL) 325 MG tablet Take 325 mg by mouth every 6 (six) hours as needed for headache.   Past Week at Unknown time  . Prenatal Vit-Fe Fumarate-FA (PRENATAL MULTIVITAMIN) TABS tablet Take 1 tablet by mouth daily at 12 noon.   11/01/2014 at Unknown time   Results for orders placed or performed during the hospital encounter of 11/02/14 (from the past 48 hour(s))  Urinalysis, Routine w reflex microscopic     Status: Abnormal   Collection Time: 11/02/14  5:48 PM  Result Value Ref Range   Color, Urine AMBER (A) YELLOW    Comment:  BIOCHEMICALS MAY BE AFFECTED BY COLOR   APPearance HAZY (A) CLEAR   Specific Gravity, Urine 1.025 1.005 - 1.030   pH 6.0 5.0 - 8.0   Glucose, UA NEGATIVE NEGATIVE mg/dL   Hgb urine dipstick LARGE (A) NEGATIVE   Bilirubin Urine NEGATIVE NEGATIVE   Ketones, ur NEGATIVE NEGATIVE mg/dL   Protein, ur NEGATIVE NEGATIVE mg/dL   Urobilinogen, UA 0.2 0.0 - 1.0 mg/dL   Nitrite NEGATIVE NEGATIVE   Leukocytes, UA NEGATIVE NEGATIVE  Urine microscopic-add on     Status: Abnormal   Collection Time: 11/02/14  5:48 PM  Result Value Ref Range   Squamous Epithelial / LPF FEW (A) RARE   WBC, UA 0-2 <3 WBC/hpf   RBC / HPF 21-50 <3 RBC/hpf   Bacteria, UA RARE RARE  Pregnancy, urine POC     Status: Abnormal   Collection Time: 11/02/14  6:08 PM  Result Value Ref Range   Preg Test, Ur POSITIVE (A) NEGATIVE    Comment:        THE SENSITIVITY OF THIS METHODOLOGY IS >24 mIU/mL   Wet prep, genital     Status: Abnormal   Collection Time: 11/02/14  7:09 PM  Result Value Ref Range   Yeast Wet Prep HPF POC NONE  SEEN NONE SEEN   Trich, Wet Prep NONE SEEN NONE SEEN   Clue Cells Wet Prep HPF POC NONE SEEN NONE SEEN   WBC, Wet Prep HPF POC FEW (A) NONE SEEN    Comment: FEW BACTERIA SEEN  CBC     Status: Abnormal   Collection Time: 11/02/14  7:16 PM  Result Value Ref Range   WBC 7.0 4.0 - 10.5 K/uL   RBC 4.30 3.87 - 5.11 MIL/uL   Hemoglobin 12.0 12.0 - 15.0 g/dL   HCT 16.1 (L) 09.6 - 04.5 %   MCV 83.3 78.0 - 100.0 fL   MCH 27.9 26.0 - 34.0 pg   MCHC 33.5 30.0 - 36.0 g/dL   RDW 40.9 81.1 - 91.4 %   Platelets 203 150 - 400 K/uL    Review of Systems  Constitutional: Negative for fever and chills.  Gastrointestinal: Negative for nausea, vomiting and abdominal pain.  Genitourinary:       + vaginal bleeding    Physical Exam   Blood pressure 111/86, pulse 88, temperature 98.5 F (36.9 C), temperature source Oral, resp. rate 18, height 5' 0.5" (1.537 m), weight 83.008 kg (183 lb), last menstrual period  07/31/2014, unknown if currently breastfeeding.  Physical Exam  Constitutional: She is oriented to person, place, and time. She appears well-developed and well-nourished. No distress.  HENT:  Head: Normocephalic.  Eyes: Pupils are equal, round, and reactive to light.  Neck: Neck supple.  Respiratory: Effort normal.  GI: Soft. She exhibits no distension. There is no tenderness. There is no rebound.  Genitourinary:  Speculum exam: Vagina - Small amount of dark red blood  Cervix - small amount of active blood  Bimanual exam: Cervix closed Uterus non tender Adnexa non tender, no masses bilaterally GC/Chlam, wet prep done Chaperone present for exam.  Musculoskeletal: Normal range of motion.  Neurological: She is alert and oriented to person, place, and time.  Skin: Skin is warm. She is not diaphoretic.  Psychiatric: Her behavior is normal.   Results for orders placed or performed during the hospital encounter of 11/02/14 (from the past 24 hour(s))  Urinalysis, Routine w reflex microscopic     Status: Abnormal   Collection Time: 11/02/14  5:48 PM  Result Value Ref Range   Color, Urine AMBER (A) YELLOW   APPearance HAZY (A) CLEAR   Specific Gravity, Urine 1.025 1.005 - 1.030   pH 6.0 5.0 - 8.0   Glucose, UA NEGATIVE NEGATIVE mg/dL   Hgb urine dipstick LARGE (A) NEGATIVE   Bilirubin Urine NEGATIVE NEGATIVE   Ketones, ur NEGATIVE NEGATIVE mg/dL   Protein, ur NEGATIVE NEGATIVE mg/dL   Urobilinogen, UA 0.2 0.0 - 1.0 mg/dL   Nitrite NEGATIVE NEGATIVE   Leukocytes, UA NEGATIVE NEGATIVE  Urine microscopic-add on     Status: Abnormal   Collection Time: 11/02/14  5:48 PM  Result Value Ref Range   Squamous Epithelial / LPF FEW (A) RARE   WBC, UA 0-2 <3 WBC/hpf   RBC / HPF 21-50 <3 RBC/hpf   Bacteria, UA RARE RARE  Pregnancy, urine POC     Status: Abnormal   Collection Time: 11/02/14  6:08 PM  Result Value Ref Range   Preg Test, Ur POSITIVE (A) NEGATIVE  Wet prep, genital      Status: Abnormal   Collection Time: 11/02/14  7:09 PM  Result Value Ref Range   Yeast Wet Prep HPF POC NONE SEEN NONE SEEN   Trich, Wet Prep NONE SEEN NONE  SEEN   Clue Cells Wet Prep HPF POC NONE SEEN NONE SEEN   WBC, Wet Prep HPF POC FEW (A) NONE SEEN  CBC     Status: Abnormal   Collection Time: 11/02/14  7:16 PM  Result Value Ref Range   WBC 7.0 4.0 - 10.5 K/uL   RBC 4.30 3.87 - 5.11 MIL/uL   Hemoglobin 12.0 12.0 - 15.0 g/dL   HCT 11.935.8 (L) 14.736.0 - 82.946.0 %   MCV 83.3 78.0 - 100.0 fL   MCH 27.9 26.0 - 34.0 pg   MCHC 33.5 30.0 - 36.0 g/dL   RDW 56.213.9 13.011.5 - 86.515.5 %   Platelets 203 150 - 400 K/uL  hCG, quantitative, pregnancy     Status: Abnormal   Collection Time: 11/02/14  7:16 PM  Result Value Ref Range   hCG, Beta Chain, Quant, S 3707 (H) <5 mIU/mL   [place] Ob Comp Less 14 Wks  11/02/2014   CLINICAL DATA:  Initial encounter for several week history of vaginal bleeding with clot passage today. Positive pregnancy test.  EXAM: OBSTETRIC <14 WK ULTRASOUND  TECHNIQUE: Transabdominal ultrasound was performed for evaluation of the gestation as well as the maternal uterus and adnexal regions.  COMPARISON:  None.  FINDINGS: Intrauterine gestational sac: Single with mildly irregular shape.  Yolk sac:  Not visualized  Embryo:  Visualized  Cardiac Activity: Not visualized  Heart Rate: N/A bpm  CRL:   4.26 cm 11 w 1 d                  US EDC: 05/23/2015  Maternal uterus/adnexae: No evidence for subchorionic hemorrhage. There is a 3.3 cm calcified fibroid identified in the anterior uterine body. Maternal right ovary is unremarkable. Maternal left ovary is not visualized. No substantial free fluid in the cul-de-sac.  IMPRESSION: There is a single intrauterine gestation identified with no evidence for fetal cardiac activity by 2D grayscale, M-mode Doppler, or color Doppler evaluation. Crown-rump length measurement estimates a gestational age just more than 2 weeks behind the gestational age estimated by LMP.  Imaging features today are compatible with intrauterine fetal demise.   Electronically Signed   By: Kennith CenterEric  Mansell M.D.   On: 11/02/2014 19:40   MAU Course  Procedures  None  MDM Unable to doppler fetal heart tones.   UA A positive blood type.   CBC Quant Report given to Thressa ShellerHeather Wells Gerdeman CNM who resumes care of the patient. Patient is in [place] currently.  2041: D/W Dr. Gaynell FaceMarshall, ok to offer the patient cytotec 800mg  today. Reviewed with him that CRL= 11 weeks 1 day. He states that it is ok to try at this time. Advise patient of bleeding precautions and to return on Friday if she has not passed tissue.  Counsled the patient at length about what the expect and bleeding precautions.       Early Intrauterine Pregnancy Failure  x  No serious current illness  x  Baseline Hgb greater than or equal to 10g/dl  x  Patient has easily accessible transportation to the hospital  x  Clear preference  x  Practitioner/physician deems patient reliable  x  Counseling by practitioner or physician  x  Patient education by RN  x  Consent form signed  NA  Rho-Gam given by RN if indicated  ___ Medication dispensed   x   Cytotec 800 mcg  __   Intravaginally by patient at home         x  Intravaginally by RN in MAU        __   Rectally by patient at home        __   Rectally by RN in MAU  ___  Ibuprofen 600 mg 1 tablet by mouth every 6 hours as needed #30  x  Hydrocodone/acetaminophen 5/325 mg by mouth every 4 to 6 hours as needed  x  Phenergan 12.5 mg by mouth every 4 hours as needed for nausea      Assessment and Plan    1. Missed abortion   2. Vaginal bleeding in pregnancy, first trimester    DC home Bleeding precautions reviewed at length Return to MAU on Friday if she has not passed the tissue by Friday.  Return to MAU with any excessive bleeding  Follow-up Information    Follow up with [facility] [place] [facility].   Why:  By Friday if bleeding  is heavy or you have not passed tissue.    Contact information:   [address] [id number] mc [place] [address] [phone]      Follow up with Kathreen Cosier, MD.   Specialty:  Obstetrics and Gynecology   Why:  If you are not having any problems keep your scheduled appointment with Dr. Gaynell Weaver.   Contact information:   [address] [place] [address] [phone]        Roxy Manns Rasch, NP 11/02/2014 7:36 PM

## 2014-11-03 LAB — GC/CHLAMYDIA PROBE AMP (~~LOC~~) NOT AT ARMC
Chlamydia: NEGATIVE
Neisseria Gonorrhea: NEGATIVE

## 2014-11-03 LAB — HIV ANTIBODY (ROUTINE TESTING W REFLEX): HIV SCREEN 4TH GENERATION: NONREACTIVE

## 2015-09-07 ENCOUNTER — Encounter (HOSPITAL_COMMUNITY): Payer: Self-pay | Admitting: *Deleted

## 2016-04-23 ENCOUNTER — Encounter: Payer: Self-pay | Admitting: *Deleted

## 2016-05-15 ENCOUNTER — Other Ambulatory Visit: Payer: Self-pay | Admitting: "Women's Health Care

## 2016-05-15 DIAGNOSIS — N644 Mastodynia: Secondary | ICD-10-CM

## 2016-05-22 ENCOUNTER — Other Ambulatory Visit: Payer: No Typology Code available for payment source

## 2016-05-24 ENCOUNTER — Other Ambulatory Visit: Payer: No Typology Code available for payment source

## 2016-05-28 ENCOUNTER — Other Ambulatory Visit (HOSPITAL_COMMUNITY): Payer: Self-pay | Admitting: *Deleted

## 2016-05-28 DIAGNOSIS — N644 Mastodynia: Secondary | ICD-10-CM

## 2016-06-18 ENCOUNTER — Ambulatory Visit
Admission: RE | Admit: 2016-06-18 | Discharge: 2016-06-18 | Disposition: A | Discharge status: Home / Self Care | Payer: No Typology Code available for payment source | Source: Ambulatory Visit | Attending: Obstetrics and Gynecology | Admitting: Obstetrics and Gynecology

## 2016-06-18 ENCOUNTER — Encounter (HOSPITAL_COMMUNITY): Payer: Self-pay

## 2016-06-18 ENCOUNTER — Ambulatory Visit (HOSPITAL_COMMUNITY)
Admission: RE | Admit: 2016-06-18 | Discharge: 2016-06-18 | Disposition: A | Discharge status: Home / Self Care | Payer: No Typology Code available for payment source | Source: Ambulatory Visit | Attending: Obstetrics and Gynecology | Admitting: Obstetrics and Gynecology

## 2016-06-18 VITALS — BP 104/68 | Temp 98.7°F | Ht 62.0 in | Wt 180.0 lb

## 2016-06-18 DIAGNOSIS — N644 Mastodynia: Secondary | ICD-10-CM

## 2016-06-18 DIAGNOSIS — Z1239 Encounter for other screening for malignant neoplasm of breast: Secondary | ICD-10-CM

## 2016-06-18 NOTE — Patient Instructions (Signed)
Explained breast self awareness to Cheryl Weaver. Patient did not need a Pap smear today due to last Pap smear was in April 2017 per patient. Let her know BCCCP will cover Pap smears every 3 years unless has a history of abnormal Pap smears. [facility] and possible right breast ultrasound. Appointment scheduled for Tuesday, June 18, 2016 at 1040. Patient aware of appointment and will be there. Cheryl Weaver verbalized understanding.  Terrill Wauters, Kathaleen Maserhristine Poll, RN 10:45 AM

## 2016-06-18 NOTE — Progress Notes (Signed)
Complaints of right breast pain x 1 1/2 months that comes and goes. Patient states the pain is a shooting pain at times. Patient stated when the pain first started she rated it at a 8 out of 10. Patient stated it has decreased some rating it at a 4-5 out of 10.  Pap Smear: Pap smear not completed today. [facility] and normal per patient. Per patient has no history of an abnormal Pap smear. No Pap smear results are in EPIC.  Physical exam: Breasts Breasts symmetrical. Bruise observed left outer breast at 9 o'clock and right outer breast at 9 o'clock that per patient was from the wire in her bra. No nipple retraction bilateral breasts. No nipple discharge bilateral breasts. No lymphadenopathy. No lumps palpated bilateral breasts. Complaints of right breast tenderness at 9 o'clock next to the areola on exam. [facility] and possible right breast ultrasound. Appointment scheduled for Tuesday, June 18, 2016 at 1040.        Pelvic/Bimanual No Pap smear completed today since last Pap smear was in April 2017 per patient. Pap smear not indicated per BCCCP guidelines.   Smoking History: Patient has never smoked.  Patient Navigation: Patient education provided. Access to services provided for [facility]. Spanish interpreter provided.  Used Spanish interpreter [employer]te from [employer].

## 2016-06-19 ENCOUNTER — Encounter (HOSPITAL_COMMUNITY): Payer: Self-pay | Admitting: *Deleted

## 2017-05-14 ENCOUNTER — Other Ambulatory Visit: Payer: Self-pay | Admitting: Obstetrics & Gynecology

## 2017-05-14 DIAGNOSIS — Z1231 Encounter for screening mammogram for malignant neoplasm of breast: Secondary | ICD-10-CM

## 2017-06-04 ENCOUNTER — Ambulatory Visit
Admission: RE | Admit: 2017-06-04 | Discharge: 2017-06-04 | Disposition: A | Discharge status: Home / Self Care | Payer: No Typology Code available for payment source | Source: Ambulatory Visit | Attending: Obstetrics & Gynecology | Admitting: Obstetrics & Gynecology

## 2017-06-04 DIAGNOSIS — Z1231 Encounter for screening mammogram for malignant neoplasm of breast: Secondary | ICD-10-CM

## 2017-07-02 ENCOUNTER — Ambulatory Visit
Admission: RE | Admit: 2017-07-02 | Discharge: 2017-07-02 | Disposition: A | Discharge status: Home / Self Care | Payer: No Typology Code available for payment source | Source: Ambulatory Visit | Attending: Obstetrics & Gynecology | Admitting: Obstetrics & Gynecology

## 2019-10-06 ENCOUNTER — Emergency Department (HOSPITAL_COMMUNITY): Payer: No Typology Code available for payment source

## 2019-10-06 ENCOUNTER — Emergency Department (HOSPITAL_COMMUNITY)
Admission: EM | Admit: 2019-10-06 | Discharge: 2019-10-06 | Disposition: A | Discharge status: Home / Self Care | Payer: No Typology Code available for payment source | Attending: Emergency Medicine | Admitting: Emergency Medicine

## 2019-10-06 ENCOUNTER — Encounter (HOSPITAL_COMMUNITY): Payer: Self-pay | Admitting: Emergency Medicine

## 2019-10-06 ENCOUNTER — Other Ambulatory Visit: Payer: Self-pay

## 2019-10-06 DIAGNOSIS — Z7982 Long term (current) use of aspirin: Secondary | ICD-10-CM | POA: Insufficient documentation

## 2019-10-06 DIAGNOSIS — Z6379 Other stressful life events affecting family and household: Secondary | ICD-10-CM | POA: Insufficient documentation

## 2019-10-06 DIAGNOSIS — R079 Chest pain, unspecified: Secondary | ICD-10-CM

## 2019-10-06 DIAGNOSIS — R0789 Other chest pain: Secondary | ICD-10-CM | POA: Insufficient documentation

## 2019-10-06 LAB — BASIC METABOLIC PANEL
Anion gap: 11 (ref 5–15)
BUN: 16 mg/dL (ref 6–20)
CO2: 21 mmol/L — ABNORMAL LOW (ref 22–32)
Calcium: 9.2 mg/dL (ref 8.9–10.3)
Chloride: 106 mmol/L (ref 98–111)
Creatinine, Ser: 0.64 mg/dL (ref 0.44–1.00)
GFR calc Af Amer: >60 mL/min (ref >60)
GFR calc non Af Amer: >60 mL/min (ref >60)
Glucose, Bld: 87 mg/dL (ref 70–99)
Potassium: 3.5 mmol/L (ref 3.5–5.1)
Sodium: 138 mmol/L (ref 135–145)

## 2019-10-06 LAB — CBC
HCT: 41.3 % (ref 36.0–46.0)
Hemoglobin: 13.5 g/dL (ref 12.0–15.0)
MCH: 29.9 pg (ref 26.0–34.0)
MCHC: 32.7 g/dL (ref 30.0–36.0)
MCV: 91.6 fL (ref 80.0–100.0)
Platelets: 255 10*3/uL (ref 150–400)
RBC: 4.51 MIL/uL (ref 3.87–5.11)
RDW: 13.3 % (ref 11.5–15.5)
WBC: 8.1 10*3/uL (ref 4.0–10.5)
nRBC: 0 % (ref 0.0–0.2)

## 2019-10-06 LAB — I-STAT BETA HCG BLOOD, ED (MC, WL, AP ONLY): I-stat hCG, quantitative: <5 m[IU]/mL (ref <5)

## 2019-10-06 LAB — TROPONIN I (HIGH SENSITIVITY)
Troponin I (High Sensitivity): 4 ng/L (ref <18)
Troponin I (High Sensitivity): 2 ng/L (ref <18)

## 2019-10-06 MED ORDER — SODIUM CHLORIDE 0.9% FLUSH: 3.0000 mL | Freq: Once | INTRAVENOUS | Status: DC

## 2019-10-06 NOTE — ED Provider Notes (Signed)
[facility] EMERGENCY DEPARTMENT Provider Note   CSN: [id number] Arrival date & time: 10/06/19  1635     History Chief Complaint  Patient presents with  . Chest Pain    Cheryl Weaver is a 44 y.o. female.  Patient without significant medical history presents for evaluation of right and central chest pain that started around 3:30 this afternoon. She denies SOB, nausea, vomiting. She states that rubbing her chest makes the pain better. No recent illness, fever, cough. She states she is under significant stress with her son having legal issues and substance abuse. No aggravating factors. The pain has been fluctuating in intensity but constant since onset.  The history is provided by the patient. A language interpreter was used.       Past Medical History:  Diagnosis Date  . Influenza   . Medical history non-contributory   . No pertinent past medical history     Patient Active Problem List   Diagnosis Date Noted  . Supervision of normal pregnancy 07/12/2011  . Pregnant state, incidental 01/10/2011    Past Surgical History:  Procedure Laterality Date  . NO PAST SURGERIES       OB History    Gravida  5   Para  4   Term  4   Preterm  0   AB  1   Living  4     SAB  1   TAB  0   Ectopic  0   Multiple  0   Live Births  4           Family History  Problem Relation Age of Onset  . Cancer Mother        cervical  . Diabetes Father   . Diabetes Brother     Social History   Tobacco Use  . Smoking status: Never Smoker  . Smokeless tobacco: Never Used  Substance Use Topics  . Alcohol use: No  . Drug use: No    Home Medications Prior to Admission medications   Medication Sig Start Date End Date Taking? Authorizing Provider  aspirin EC 81 MG tablet Take 81 mg by mouth as needed for mild pain.   Yes [provider]  HYDROcodone-acetaminophen (NORCO/VICODIN) 5-325 MG per tablet Take 1-2 tablets by mouth every 4  (four) hours as needed for moderate pain. Patient not taking: Reported on 06/18/2016 11/02/14   Tresea Mall, CNM  promethazine (PHENERGAN) 25 MG tablet Take 0.5-1 tablets (12.5-25 mg total) by mouth every 6 (six) hours as needed. Patient not taking: Reported on 06/18/2016 11/02/14   Tresea Mall, CNM    Allergies    Patient has no known allergies.  Review of Systems   Review of Systems  Constitutional: Negative for chills and fever.  HENT: Negative.   Respiratory: Negative.  Negative for cough and shortness of breath.   Cardiovascular: Positive for chest pain.  Gastrointestinal: Negative.  Negative for abdominal pain and vomiting.  Musculoskeletal: Negative.   Skin: Negative.   Neurological: Negative.  Negative for weakness.    Physical Exam Updated Vital Signs BP 133/62   Pulse 73   Temp 97.9 F (36.6 C) (Oral)   Resp (!) 23   LMP 09/20/2019   SpO2 99%   Physical Exam Vitals and nursing note reviewed.  Constitutional:      Appearance: She is well-developed.  HENT:     Head: Normocephalic.  Cardiovascular:     Rate and Rhythm: Normal rate and  regular rhythm.  Pulmonary:     Effort: Pulmonary effort is normal.     Breath sounds: Normal breath sounds. No wheezing, rhonchi or rales.  Chest:     Chest wall: No tenderness.  Abdominal:     General: Bowel sounds are normal.     Palpations: Abdomen is soft.     Tenderness: There is no abdominal tenderness. There is no guarding or rebound.  Musculoskeletal:        General: Normal range of motion.     Cervical back: Normal range of motion and neck supple.     Right lower leg: No edema.     Left lower leg: No edema.  Skin:    General: Skin is warm and dry.     Findings: No rash.  Neurological:     Mental Status: She is alert and oriented to person, place, and time.     ED Results / Procedures / Treatments   Labs (all labs ordered are listed, but only abnormal results are displayed) Labs Reviewed  BASIC  METABOLIC PANEL - Abnormal; Notable for the following components:      Result Value   CO2 21 (*)    All other components within normal limits  CBC  I-STAT BETA HCG BLOOD, ED ([facility], [facility], [facility] ONLY)  TROPONIN I (HIGH SENSITIVITY)  TROPONIN I (HIGH SENSITIVITY)   Results for orders placed or performed during the hospital encounter of 10/06/19  Basic metabolic panel  Result Value Ref Range   Sodium 138 135 - 145 mmol/L   Potassium 3.5 3.5 - 5.1 mmol/L   Chloride 106 98 - 111 mmol/L   CO2 21 (L) 22 - 32 mmol/L   Glucose, Bld 87 70 - 99 mg/dL   BUN 16 6 - 20 mg/dL   Creatinine, Ser 7.61 0.44 - 1.00 mg/dL   Calcium 9.2 8.9 - 95.0 mg/dL   GFR calc non Af Amer >60 >60 mL/min   GFR calc Af Amer >60 >60 mL/min   Anion gap 11 5 - 15  CBC  Result Value Ref Range   WBC 8.1 4.0 - 10.5 K/uL   RBC 4.51 3.87 - 5.11 MIL/uL   Hemoglobin 13.5 12.0 - 15.0 g/dL   HCT 93.2 67.1 - 24.5 %   MCV 91.6 80.0 - 100.0 fL   MCH 29.9 26.0 - 34.0 pg   MCHC 32.7 30.0 - 36.0 g/dL   RDW 80.9 98.3 - 38.2 %   Platelets 255 150 - 400 K/uL   nRBC 0.0 0.0 - 0.2 %  I-Stat beta hCG blood, ED  Result Value Ref Range   I-stat hCG, quantitative <5.0 <5 mIU/mL   Comment 3          Troponin I (High Sensitivity)  Result Value Ref Range   Troponin I (High Sensitivity) 4 <18 ng/L  Troponin I (High Sensitivity)  Result Value Ref Range   Troponin I (High Sensitivity) 2 <18 ng/L    EKG EKG Interpretation  Date/Time:  Wednesday October 06 2019 16:35:06 EST Ventricular Rate:  79 PR Interval:  156 QRS Duration: 74 QT Interval:  352 QTC Calculation: 403 R Axis:   61 Text Interpretation: Normal sinus rhythm Low voltage QRS Borderline ECG no prior available for comparison Confirmed by Tilden Fossa [phone]) on 10/06/2019 9:24:37 PM   Radiology DG Chest 2 View  Result Date: 10/06/2019 CLINICAL DATA:  Chest pain EXAM: CHEST - 2 VIEW COMPARISON:  06/05/2005 FINDINGS: No acute consolidation or effusion. Normal  cardiomediastinal silhouette. No pneumothorax. Stable rounded opacity between the eighth and ninth ribs to the right of midline, likely benign given lack of interval change. IMPRESSION: No active cardiopulmonary disease. Electronically Signed   By: Jasmine Pang M.D.   On: 10/06/2019 17:14    Procedures Procedures (including critical care time)  Medications Ordered in ED Medications  sodium chloride flush (NS) 0.9 % injection 3 mL (has no administration in time range)    ED Course  I have reviewed the triage vital signs and the nursing notes.  Pertinent labs & imaging results that were available during my care of the patient were reviewed by me and considered in my medical decision making (see chart for details).    MDM Rules/Calculators/A&P                      Patient to ED with chest pain. Pain is nonradiating, constant. No respiratory symptoms or signs of illness.   The patient is well appearing. Pain is atypical for cardiac chest pain. Labs, including 2 troponins, and EKG show no sign consistent with ischemia. Pain is reproducible.   Feel she is safe for discharge home. Recommend follow up with PCP (referral made) for management if symptoms persist.  Final Clinical Impression(s) / ED Diagnoses Final diagnoses:  None   1. Nonspecific chest pain  Rx / DC Orders ED Discharge Orders    None       Danne Harbor 10/06/19 2335    Tilden Fossa, MD 10/08/19 1430

## 2019-10-06 NOTE — ED Notes (Signed)
Patient verbalizes understanding of discharge instructions. Opportunity for questioning and answers were provided. Armband removed by staff, pt discharged from ED.  

## 2019-10-06 NOTE — Discharge Instructions (Addendum)
You can be discharged home. Your chest pain is not felt to be related to a heart condition. Please follow up with a primary care provider for management if symptoms persist or worsen.   Return to the emergency department with any new or worsening symptoms.

## 2019-10-06 NOTE — ED Triage Notes (Signed)
C/o pounding pain to center of chest that radiates to R shoulder since Monday.  Pt states pain is better with palpation and R arm movement.  Currently under a lot of stress at home from her son.  Denies SOB. Reports nausea.

## 2020-08-28 ENCOUNTER — Other Ambulatory Visit: Payer: Self-pay | Admitting: Obstetrics & Gynecology

## 2020-08-28 DIAGNOSIS — Z1231 Encounter for screening mammogram for malignant neoplasm of breast: Secondary | ICD-10-CM

## 2020-09-28 ENCOUNTER — Ambulatory Visit
Admission: RE | Admit: 2020-09-28 | Discharge: 2020-09-28 | Disposition: A | Discharge status: Home / Self Care | Payer: No Typology Code available for payment source | Source: Ambulatory Visit | Attending: Obstetrics & Gynecology | Admitting: Obstetrics & Gynecology

## 2020-09-28 ENCOUNTER — Other Ambulatory Visit: Payer: Self-pay

## 2020-09-28 DIAGNOSIS — Z1231 Encounter for screening mammogram for malignant neoplasm of breast: Secondary | ICD-10-CM

## 2021-05-02 ENCOUNTER — Other Ambulatory Visit: Payer: Self-pay

## 2021-05-02 ENCOUNTER — Encounter: Payer: Self-pay | Admitting: Internal Medicine

## 2021-05-02 ENCOUNTER — Ambulatory Visit: Payer: Self-pay | Admitting: Internal Medicine

## 2021-05-02 VITALS — BP 122/78 | HR 68 | Resp 12 | Ht 61.0 in | Wt 185.0 lb

## 2021-05-02 DIAGNOSIS — M5441 Lumbago with sciatica, right side: Secondary | ICD-10-CM

## 2021-05-02 DIAGNOSIS — Z23 Encounter for immunization: Secondary | ICD-10-CM

## 2021-05-02 DIAGNOSIS — G8929 Other chronic pain: Secondary | ICD-10-CM

## 2021-05-02 DIAGNOSIS — G5601 Carpal tunnel syndrome, right upper limb: Secondary | ICD-10-CM

## 2021-05-02 NOTE — Progress Notes (Signed)
 Subjective:    Patient ID: Cheryl Weaver, female   DOB: [birth year], 45 y.o.   MRN: [id number]   HPI  Here to establish [name] interprets  Poor historian.   Right sided pain:  states since maybe 12.21 she has had pain she feels originates in her right foot that radiates up her lateral leg to low back, up her right flank to right shoulder and up her arm to her shoulder from her right thenar eminence.  Has come on gradually without an acute onset or injury. Describes the pain as something "pulling on her nerve" and a "hot" feeling.   The discomfort comes and goes. Two episodes occur generally over a 2-3 week time period.   Generally only at nighttime and lasts only 2 minutes.  She can awaken with the pain.   The above discomfort occurs generally at same time, but there are times when only the lower body is involved.   She does have tingling in her right leg.  Can have in her arm when asleep with her right arm extended at 90 degrees from body.  Throughout the night, has to shake out limbs to get tingling to resolve.  Sleeps in all positions--feels she is constantly moving.   Was evaluated for what she thinks is the same thing about 3 years ago.  Could not get out of bed after catching her right low back going up the stairs during a stressful time. Received an injection at the clinic.(?[facility] by description)  She states she was given an injection at the time.  Insists this was in her spine, but not clear if just an IM injection.  She was able to move about much better right after the injection--describes 4 or 5 injections.  States they diagnosed her with sciatica.  States her symptoms were only in her back at the time.    Upper body symptoms started about 1.5 years ago.  Has taken Tylenol or Aleve at times, both of which help with the pain.  She is able to sleep through the night.  2.  Stress:  Feels secondary to her 2 children who have legal issues, ages 72 and  45 yo.  Has 4 children in total.  The younger two are 32 and 45 yo.  She did get two classes set up with [facility] [place] for parenting.  She did not get counseling at the time.     No outpatient medications have been marked as taking for the 05/02/21 encounter (Office Visit) with Ronalee Cocking, MD.   No Known Allergies  Past Medical History:  Diagnosis Date   Gastritis 1999   Diagnosed in [place].  Treated with unknown medication--sounds like acid reducer.   Influenza    Past Surgical History:  Procedure Laterality Date   NO PAST SURGERIES     Family History  Problem Relation Age of Onset   Cancer Mother        cervical   Hyperlipidemia Father    Hypertension Father    Diabetes Father    Hypertension Sister    Diabetes Brother    Breast cancer Neg Hx    Social History   Tobacco Use   Smoking status: Never   Smokeless tobacco: Never  Substance Use Topics   Alcohol use: No   Drug use: No        Review of Systems    Objective:   BP 122/78 (BP Location: Left Arm, Patient Position:  Sitting, Cuff Size: Normal)   Pulse 68   Resp 12   Ht 5\' 1"  (1.549 m)   Wt 185 lb (83.9 kg)   Breastfeeding No   BMI 34.96 kg/m   Physical Exam HENT:     Head: Normocephalic and atraumatic.     Right Ear: Tympanic membrane normal.     Left Ear: Tympanic membrane normal.     Mouth/Throat:     Mouth: Mucous membranes are moist.     Pharynx: Oropharynx is clear.  Eyes:     Extraocular Movements: Extraocular movements intact.     Pupils: Pupils are equal, round, and reactive to light.  Cardiovascular:     Rate and Rhythm: Normal rate and regular rhythm.     Pulses: Normal pulses.     Heart sounds: No murmur heard.    No friction rub.  Pulmonary:     Effort: Pulmonary effort is normal.     Breath sounds: Normal breath sounds.  Abdominal:     General: Bowel sounds are normal.     Palpations: Abdomen is soft. There is no mass.     Tenderness: There is no  abdominal tenderness.  Musculoskeletal:     Cervical back: Normal range of motion and neck supple.     Thoracic back: No spasms, tenderness or bony tenderness. Normal range of motion. No scoliosis.     Lumbar back: No spasms, tenderness or bony tenderness. Normal range of motion. No scoliosis.  Neurological:     Mental Status: She is alert and oriented to person, place, and time.     Cranial Nerves: Cranial nerves 2-12 are intact.     Sensory: Sensation is intact.     Motor: Motor function is intact.     Coordination: Coordination is intact.     Deep Tendon Reflexes: Reflexes are normal and symmetric.     Comments: + Tinels and Phalens at right wrist. Grip strong.      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    2.  Right Carpal Tunnel Syndrome:  Cock up splint to be worn nightly until follow up.  Ibuprofen 400-600 mg twice daily with food for 2 weeks.  3.  HM:  COVID Pfizer Bivalent vaccine.    Return for CPE

## 2021-05-02 NOTE — Patient Instructions (Signed)
[facility]  Directions  Website Address: [address], [place], [place] [address]  Phone: [phone]    

## 2021-08-03 ENCOUNTER — Ambulatory Visit: Payer: Self-pay | Admitting: Internal Medicine

## 2021-08-27 ENCOUNTER — Ambulatory Visit: Payer: Self-pay | Admitting: Internal Medicine

## 2021-12-28 ENCOUNTER — Encounter: Payer: Self-pay | Admitting: Internal Medicine

## 2021-12-28 ENCOUNTER — Ambulatory Visit: Payer: Self-pay | Admitting: Internal Medicine

## 2021-12-28 VITALS — BP 126/80 | HR 68 | Resp 12 | Ht 61.5 in | Wt 189.0 lb

## 2021-12-28 DIAGNOSIS — L83 Acanthosis nigricans: Secondary | ICD-10-CM | POA: Insufficient documentation

## 2021-12-28 DIAGNOSIS — Z1159 Encounter for screening for other viral diseases: Secondary | ICD-10-CM

## 2021-12-28 DIAGNOSIS — M7712 Lateral epicondylitis, left elbow: Secondary | ICD-10-CM | POA: Insufficient documentation

## 2021-12-28 DIAGNOSIS — Z0000 Encounter for general adult medical examination without abnormal findings: Secondary | ICD-10-CM

## 2021-12-28 DIAGNOSIS — E010 Iodine-deficiency related diffuse (endemic) goiter: Secondary | ICD-10-CM | POA: Insufficient documentation

## 2021-12-28 DIAGNOSIS — Z5982 Transportation insecurity: Secondary | ICD-10-CM | POA: Insufficient documentation

## 2021-12-28 DIAGNOSIS — Z23 Encounter for immunization: Secondary | ICD-10-CM

## 2021-12-28 DIAGNOSIS — Z6835 Body mass index (BMI) 35.0-35.9, adult: Secondary | ICD-10-CM
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
## 2021-12-28 NOTE — Patient Instructions (Addendum)
Tome un vaso de agua antes de cada comida Tome un minimo de 6 a 8 vasos de agua diarios Coma tres veces al dia Coma una proteina y Neomia Dear grasa saludable con comida.  (huevos, pescado, pollo, pavo, y limite carnes rojas Coma 5 porciones diarias de legumbres.  Mezcle los colores Coma 2 porciones diarias de frutas con cascara cuando sea comestible Use platos pequeos Suelte su tenedor o cuchara despues de cada mordida hata que se mastique y se trague Come en la mesa con amigos o familiares por lo menos una vez al dia Apague la televisin y aparatos electrnicos durante la comida  Su objetivo debe ser perder una libra por semana  Estudios recientes indican que las personas quienes consumen todos de sus calorias durante 12 horas se bajan de pesocon Mas eficiencia.  Por ejemplo, si Usted come su primera comida a las 7:00 a.m., su comida final del dia se debe completar antes de las 7:00 p.m.   [facility]  Address: [address] [place], [place], [place] [address]  Phone: [phone] Please walk into the clinic between 9 a.m. and 1 p.m. Mon-Fri to establish Virtual visits are available

## 2021-12-29 LAB — COMPREHENSIVE METABOLIC PANEL
ALT: 21 IU/L (ref 0–32)
AST: 16 IU/L (ref 0–40)
Albumin/Globulin Ratio: 1.8 (ref 1.2–2.2)
Albumin: 4.4 g/dL (ref 3.8–4.8)
Alkaline Phosphatase: 63 IU/L (ref 44–121)
BUN/Creatinine Ratio: 28 — ABNORMAL HIGH (ref 9–23)
BUN: 15 mg/dL (ref 6–24)
Bilirubin Total: 0.3 mg/dL (ref 0.0–1.2)
CO2: 20 mmol/L (ref 20–29)
Calcium: 9.1 mg/dL (ref 8.7–10.2)
Chloride: 103 mmol/L (ref 96–106)
Creatinine, Ser: 0.54 mg/dL — ABNORMAL LOW (ref 0.57–1.00)
Globulin, Total: 2.4 g/dL (ref 1.5–4.5)
Glucose: 91 mg/dL (ref 70–99)
Potassium: 4 mmol/L (ref 3.5–5.2)
Sodium: 137 mmol/L (ref 134–144)
Total Protein: 6.8 g/dL (ref 6.0–8.5)
eGFR: 115 mL/min/{1.73_m2} (ref >59)

## 2021-12-29 LAB — CBC WITH DIFFERENTIAL/PLATELET
Basophils Absolute: 0.1 10*3/uL (ref 0.0–0.2)
Basos: 1 %
EOS (ABSOLUTE): 0.2 10*3/uL (ref 0.0–0.4)
Eos: 2 %
Hematocrit: 39.2 % (ref 34.0–46.6)
Hemoglobin: 13.4 g/dL (ref 11.1–15.9)
Immature Grans (Abs): 0 10*3/uL (ref 0.0–0.1)
Immature Granulocytes: 0 %
Lymphocytes Absolute: 2.3 10*3/uL (ref 0.7–3.1)
Lymphs: 33 %
MCH: 29.9 pg (ref 26.6–33.0)
MCHC: 34.2 g/dL (ref 31.5–35.7)
MCV: 88 fL (ref 79–97)
Monocytes Absolute: 0.4 10*3/uL (ref 0.1–0.9)
Monocytes: 6 %
Neutrophils Absolute: 4 10*3/uL (ref 1.4–7.0)
Neutrophils: 58 %
Platelets: 234 10*3/uL (ref 150–450)
RBC: 4.48 x10E6/uL (ref 3.77–5.28)
RDW: 13 % (ref 11.7–15.4)
WBC: 7 10*3/uL (ref 3.4–10.8)

## 2021-12-29 LAB — LIPID PANEL W/O CHOL/HDL RATIO
Cholesterol, Total: 177 mg/dL (ref 100–199)
HDL: 44 mg/dL (ref >39)
LDL Chol Calc (NIH): 107 mg/dL — ABNORMAL HIGH (ref 0–99)
Triglycerides: 147 mg/dL (ref 0–149)
VLDL Cholesterol Cal: 26 mg/dL (ref 5–40)

## 2021-12-29 LAB — TSH: TSH: 2.9 u[IU]/mL (ref 0.450–4.500)

## 2021-12-29 LAB — HEPATITIS C ANTIBODY: Hep C Virus Ab: NONREACTIVE

## 2021-12-29 LAB — HGB A1C W/O EAG: Hgb A1c MFr Bld: 5.6 % (ref 4.8–5.6)

## 2022-05-08 ENCOUNTER — Ambulatory Visit: Payer: No Typology Code available for payment source

## 2022-07-25 ENCOUNTER — Telehealth: Payer: Self-pay

## 2022-07-25 NOTE — Telephone Encounter (Signed)
Telephoned patient with interpreter, [name]. Left voice message with [facility] (scholarship) contact information.

## 2022-07-26 ENCOUNTER — Other Ambulatory Visit: Payer: Self-pay | Admitting: Obstetrics & Gynecology

## 2022-07-26 DIAGNOSIS — Z1231 Encounter for screening mammogram for malignant neoplasm of breast: Secondary | ICD-10-CM

## 2022-08-22 ENCOUNTER — Ambulatory Visit
Admission: RE | Admit: 2022-08-22 | Discharge: 2022-08-22 | Disposition: A | Discharge status: Home / Self Care | Payer: No Typology Code available for payment source | Source: Ambulatory Visit | Attending: Obstetrics & Gynecology | Admitting: Obstetrics & Gynecology

## 2022-08-22 DIAGNOSIS — Z1231 Encounter for screening mammogram for malignant neoplasm of breast: Secondary | ICD-10-CM

## 2022-12-18 NOTE — Congregational Nurse Program (Signed)
  Dept: [phone]   Congregational Nurse Program Note  Date of Encounter: 12/18/2022 Pt seeking help for painful right elbow and arm; does not necessarily want to see the doctor at this point; info provided for orange card application and upcoming orange card fair at [facility]  Pt also states under a lot of family stress with 2 adult children who live at home causing stress. Refer to [facility]; offered to call for her, she is going to discuss with spouse and then decide Jimmye Norman RN   Past Medical History: Past Medical History:  Diagnosis Date   Gastritis 1999   Diagnosed in [place].  Treated with unknown medication--sounds like acid reducer.   Influenza     Encounter Details:  CNP Questionnaire - 12/18/22 1512       Questionnaire   Ask client: Do you give verbal consent for me to treat you today? Yes    Student Assistance N/A    Location Patient Served  [facility]    Visit Setting with Client Organization    Patient Status Migrant    Insurance Uninsured ([place] Card/Care Connects/Self-Pay/Medicaid Family Planning)    Insurance/Financial Assistance Referral Orange Card/Care Connects    Medication N/A    Medical Provider No    Screening Referrals Made N/A    Medical Referrals Made Non-[facility]    Medical Appointment Made N/A    Recently w/o PCP, now 1st time PCP visit completed due to CNs referral or appointment made N/A    Food N/A    Transportation N/A    Housing/Utilities N/A    Interpersonal Safety N/A    Interventions Advocate/Support;Navigate Healthcare System;Educate    Abnormal to Normal Screening Since Last CN Visit N/A    Screenings CN Performed N/A    Sent Client to Lab for: N/A    Did client attend any of the following based off CNs referral or appointments made? N/A    ED Visit Averted N/A    Life-Saving Intervention Made N/A

## 2023-02-28 IMAGING — MG MM DIGITAL SCREENING BILAT W/ TOMO AND CAD
8 series · 8 of 24 positions shown · non-contrast
Comparison: Previous exam(s).

ACR Breast Density Category a: The breast tissue is almost entirely
fatty.

CLINICAL DATA: Screening.

EXAM:
DIGITAL SCREENING BILATERAL MAMMOGRAM WITH TOMOSYNTHESIS AND CAD
TECHNIQUE: Bilateral screening digital craniocaudal and mediolateral oblique
mammograms were obtained. Bilateral screening digital breast
tomosynthesis was performed. The images were evaluated with
computer-aided detection.

[R CC synth-2D]
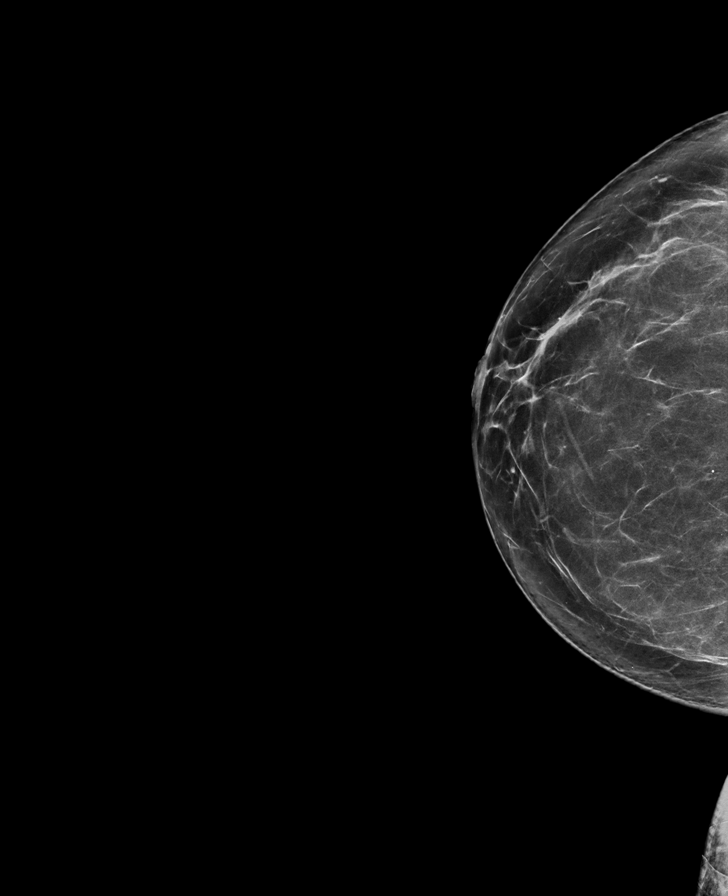

[L CC synth-2D]
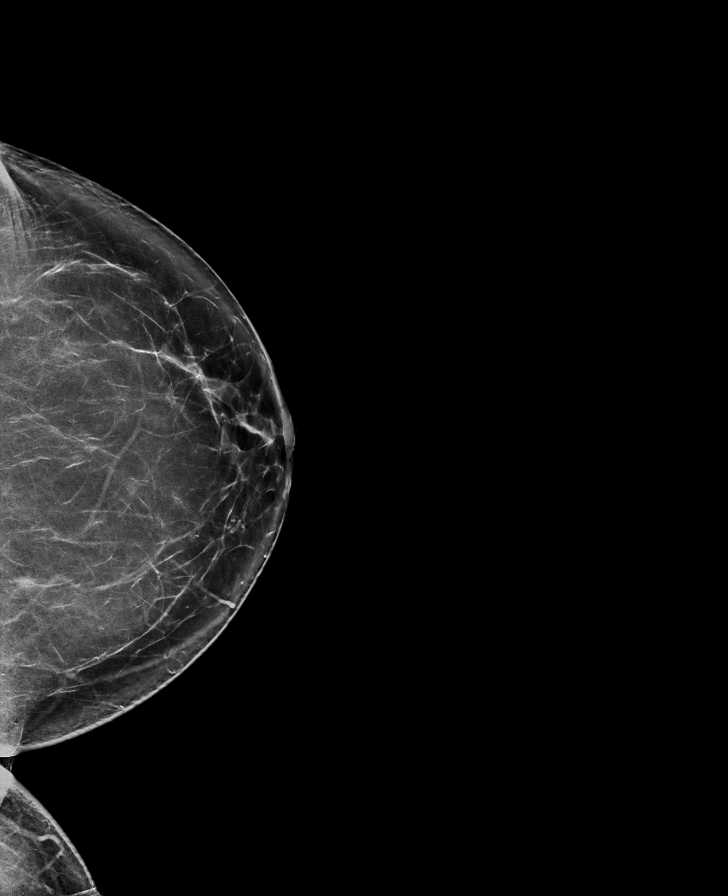

[L MLO synth-2D]
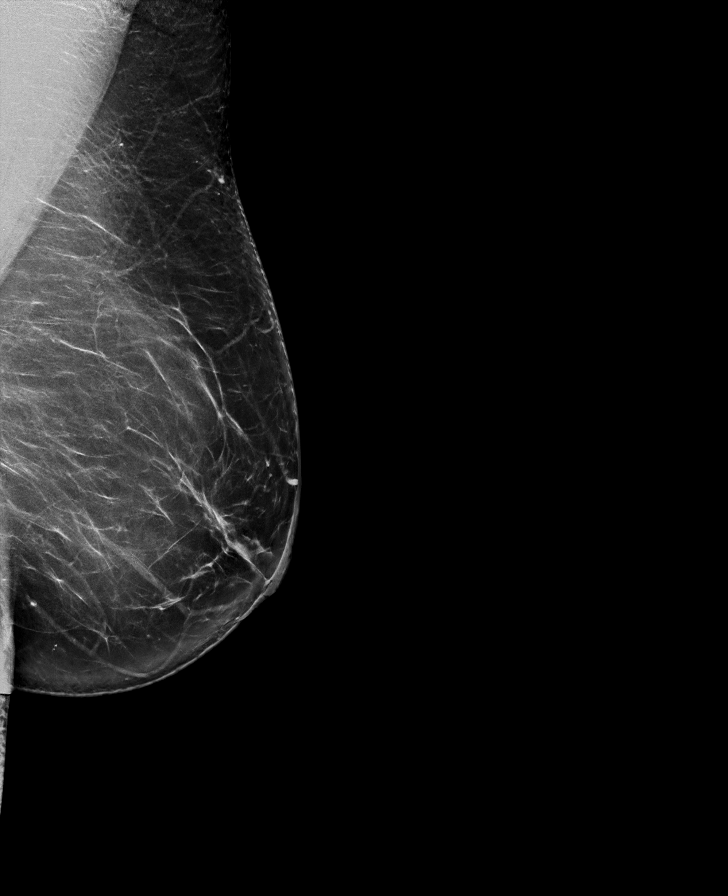

[R MLO synth-2D]
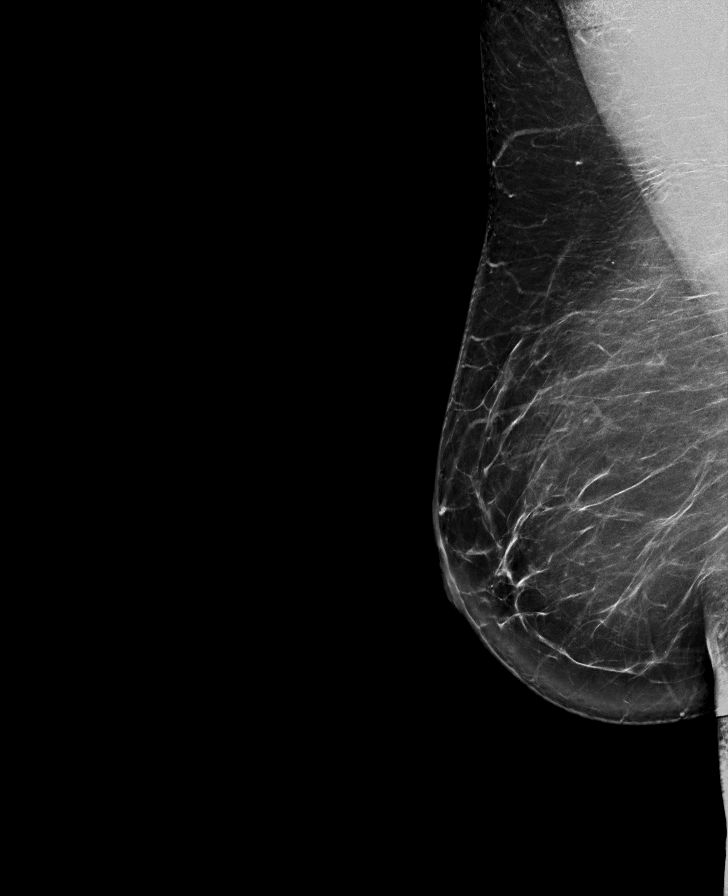

[L CC tomo · tomo slice 39/78.0]
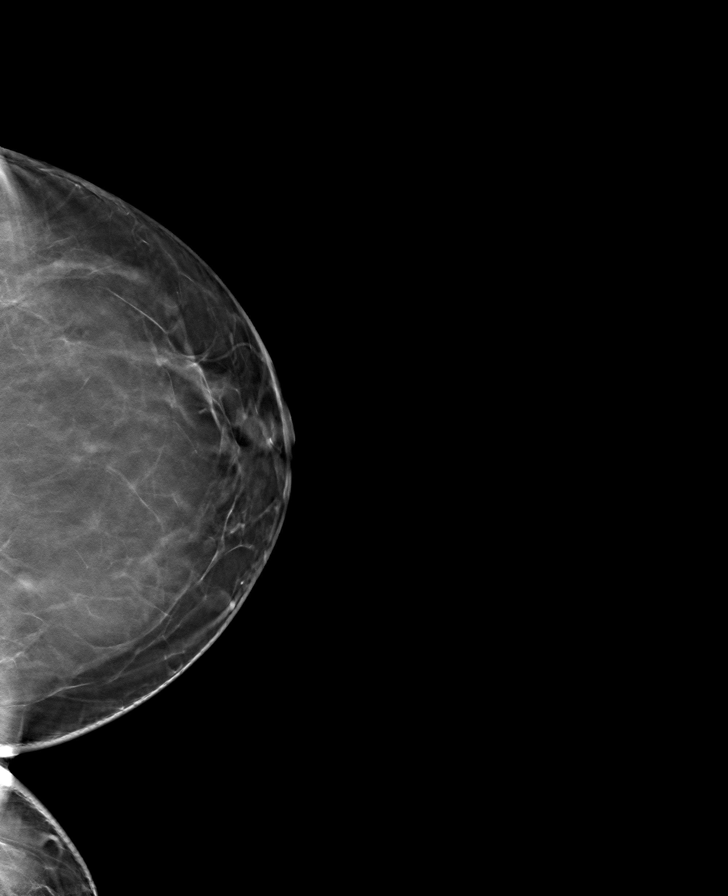

[R MLO tomo · tomo slice 45/88.0]
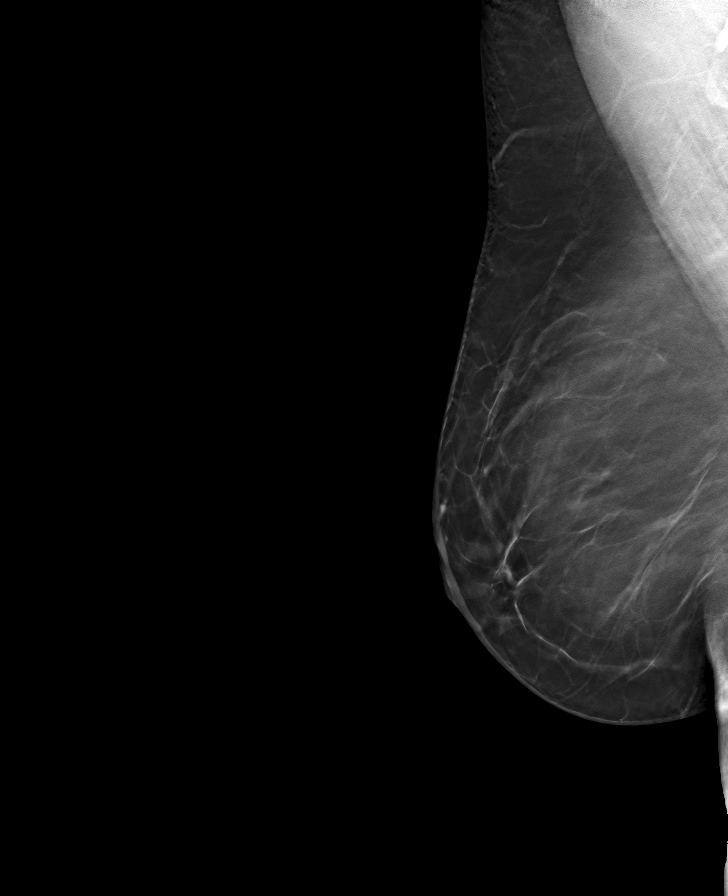

[R CC tomo · tomo slice 36/71.0]
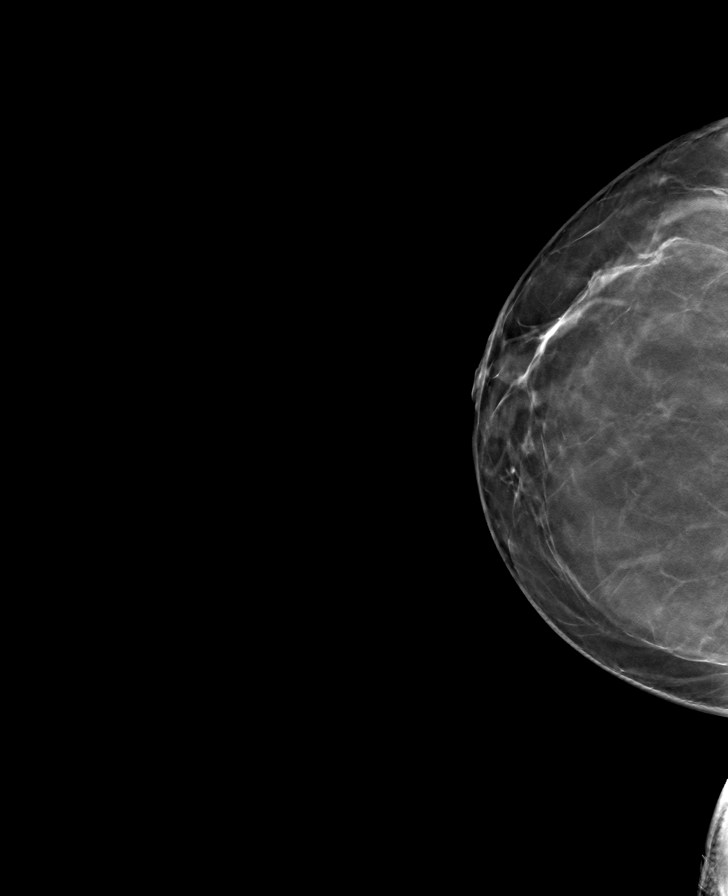

[L MLO tomo · tomo slice 44/87.0]
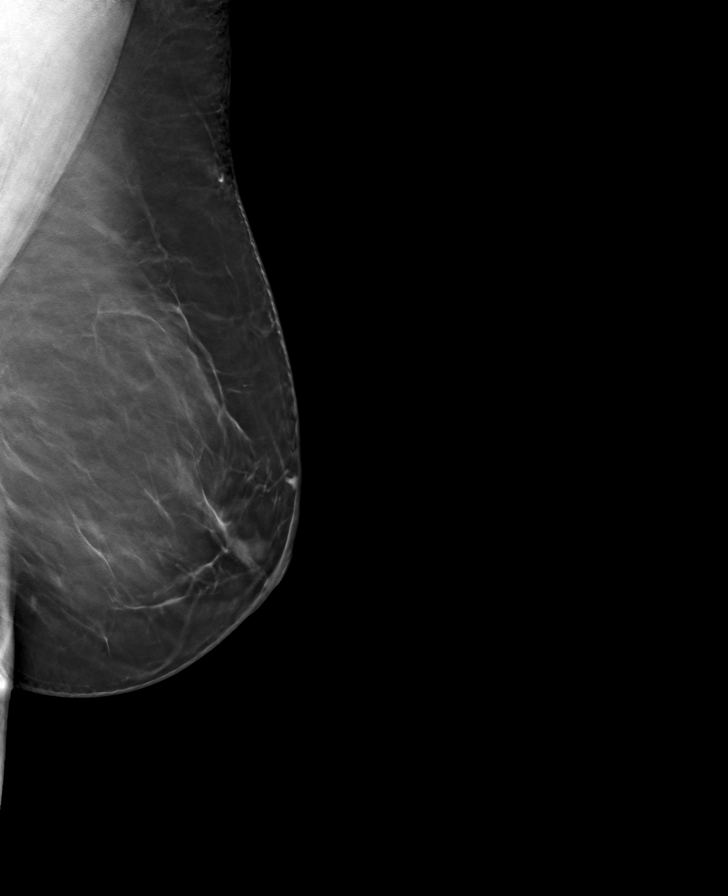

[8 of 24 positions shown; findings below may reference images not displayed]

FINDINGS: There are no findings suspicious for malignancy. The images were
evaluated with computer-aided detection.
IMPRESSION: No mammographic evidence of malignancy. A result letter of this
screening mammogram will be mailed directly to the patient.

RECOMMENDATION:
Screening mammogram in one year. (Code:JP-J-DD5)

BI-RADS CATEGORY  1: Negative.

## 2023-11-04 ENCOUNTER — Telehealth: Payer: Self-pay

## 2023-11-04 NOTE — Telephone Encounter (Signed)
 Patient needs an appointment for   Patient has been having headaches everyday for the past two months,  Patient has taken ibuprofen or tylenol for pain.   Patient is available any time .   We will call patient if there is a cancellation.

## 2023-11-11 DIAGNOSIS — G8929 Other chronic pain: Secondary | ICD-10-CM | POA: Insufficient documentation

## 2023-11-11 DIAGNOSIS — G5601 Carpal tunnel syndrome, right upper limb: Secondary | ICD-10-CM | POA: Insufficient documentation

## 2023-11-11 NOTE — Telephone Encounter (Signed)
 Patient has been scheduled

## 2023-11-12 ENCOUNTER — Ambulatory Visit: Payer: Self-pay | Admitting: Internal Medicine

## 2023-11-12 NOTE — Telephone Encounter (Signed)
 Patient had to cancel schedule appointment and would like to be back on wait list.

## 2023-11-17 NOTE — Telephone Encounter (Signed)
Called patient to offer appointment, patient did not answer.

## 2023-11-19 NOTE — Telephone Encounter (Signed)
 Attempted to call patient a couple of times to offer appointment, patient did not answer and did not return call.

## 2023-11-20 NOTE — Telephone Encounter (Signed)
Called patient to offer appointment, patient did not answer.

## 2023-12-23 NOTE — Telephone Encounter (Signed)
 Left voicemail with interpreter [phone] to return call

## 2024-04-21 ENCOUNTER — Ambulatory Visit: Payer: Self-pay | Admitting: Internal Medicine

## 2024-04-21 ENCOUNTER — Encounter: Payer: Self-pay | Admitting: Internal Medicine

## 2024-04-21 VITALS — BP 122/80 | HR 82 | Resp 18 | Ht 61.0 in | Wt 197.0 lb

## 2024-04-21 DIAGNOSIS — E010 Iodine-deficiency related diffuse (endemic) goiter: Secondary | ICD-10-CM

## 2024-04-21 DIAGNOSIS — Z638 Other specified problems related to primary support group: Secondary | ICD-10-CM

## 2024-04-21 DIAGNOSIS — K219 Gastro-esophageal reflux disease without esophagitis: Secondary | ICD-10-CM | POA: Insufficient documentation

## 2024-04-21 DIAGNOSIS — Z0000 Encounter for general adult medical examination without abnormal findings: Secondary | ICD-10-CM

## 2024-04-21 DIAGNOSIS — E66812 Obesity, class 2: Secondary | ICD-10-CM

## 2024-04-21 DIAGNOSIS — L83 Acanthosis nigricans: Secondary | ICD-10-CM

## 2024-04-21 MED ORDER — FAMOTIDINE 40 MG PO TABS: 40.0000 mg | ORAL_TABLET | Freq: Every day | ORAL | 6 refills | Status: AC

## 2024-04-21 MED ORDER — MAGNESIUM 30 MG PO TABS: ORAL_TABLET | ORAL | Status: AC

## 2024-04-21 NOTE — Patient Instructions (Addendum)
 2.5 minutes out and 2.5 minutes back and add 5 minutes every Monday until up to 60 minutes daily.    Tome un vaso de agua antes de cada comida Tome un minimo de 6 a 8 vasos de agua diarios Coma tres veces al dia Coma una proteina y ignacia grasa saludable con comida.  (huevos, pescado, pollo, pavo, y limite carnes rojas Coma 5 porciones diarias de legumbres.  Mezcle los colores Coma 2 porciones diarias de frutas con cascara cuando sea comestible Use platos pequeos Suelte su tenedor o cuchara despues de cada mordida hata que se mastique y se trague Come en la mesa con amigos o familiares por lo menos una vez al dia Apague la televisin y aparatos electrnicos durante la comida  Su objetivo debe ser perder una libra por semana  Estudios recientes indican que las personas quienes consumen todos de sus calorias durante 12 horas se bajan de pesocon Mas eficiencia.  Por ejemplo, si Usted come su primera comida a las 7:00 a.m., su comida final del dia se debe completar antes de las 7:00 p.m.

## 2024-04-21 NOTE — Progress Notes (Unsigned)
 Subjective:    Patient ID: Cheryl Weaver, female   DOB: 07-14-1976, 48 y.o.   MRN: [id number]   HPI  CPE without pap  1.  Pap: Gets paps done at [facility].  Next due Jan or Feb of 2026.  Always normal.  Had IUD removed in 08/2022.    2.  Mammogram:  Last 07/2022 and normal.  [facility] through [facility] [place] [facility].  No family history of breast cancer.    3.  Osteoprevention:  Drinks 1 cup of lactose free milk daily.  Kefir once daily.  She is willing to increase milk to 3 servings daily.  Not physically active.    4.  Guaiac Cards/FIT:  Never.    5.  Colonoscopy:  Never.  No family history of colon cancer.   6.  Immunizations:  Needs Influenza vaccine. Immunization History  Administered Date(s) Administered   BCG 03/07/1976   Dtap, Unspecified 11/12/1975, 12/04/1975, 12/10/1975   IPV 09/10/2023   Influenza Split 05/30/2011   Influenza, Seasonal, Injecte, Preservative Fre 09/10/2023   Influenza,inj,Quad PF,6+ Mos 05/07/2013, 05/08/2022   MMR 09/10/2023   Measles 09/18/1975   PFIZER(Purple Top)SARS-COV-2 Vaccination 12/04/2019, 12/23/2019   Pfizer Covid-19 Vaccine Bivalent Booster 72yrs & up 05/02/2021   Td 12/28/2021   Td,absorbed, Preservative Free, Adult Use, Lf Unspecified 11/07/1987   Tdap 07/11/2011, 09/10/2023     7.  Glucose/Cholesterol:  mild elevation of glucose in past, but A1C in normal range.  Cholesterol fine in past.   Lipid Panel     Component Value Date/Time   CHOL 177 12/28/2021 1242   TRIG 147 12/28/2021 1242   HDL 44 12/28/2021 1242   LDLCALC 107 (H) 12/28/2021 1242   LABVLDL 26 12/28/2021 1242     No outpatient medications have been marked as taking for the 04/21/24 encounter (Office Visit) with Adella Norris, MD.   No Known Allergies  Past Medical History:  Diagnosis Date   Gastritis 1999   Diagnosed in [place].  Treated with unknown medication--sounds like acid reducer.   Influenza    Past Surgical  History:  Procedure Laterality Date   NO PAST SURGERIES     Family History  Problem Relation Age of Onset   Cancer Mother        cervical   Hyperlipidemia Father    Hypertension Father    Diabetes Father    Hypertension Sister    Diabetes Brother    Breast cancer Neg Hx    Social History   Socioeconomic History   Marital status: Married    Spouse name: Cheryl Weaver   Number of children: 4   Years of education: Not on file   Highest education level: Not on file  Occupational History   Not on file  Tobacco Use   Smoking status: Never   Smokeless tobacco: Never  Substance and Sexual Activity   Alcohol use: No   Drug use: No   Sexual activity: Yes    Birth control/protection: Condom  Other Topics Concern   Not on file  Social History Narrative   Lives with husband and 4 children.     Sons smoke MJ in home and cannot get them to stop--home smells because of it   Social Drivers of Health   Financial Resource Strain: Low Risk  (04/21/2024)   Overall Financial Resource Strain (CARDIA)    Difficulty of Paying Living Expenses: Not very hard  Food Insecurity: No Food Insecurity (04/21/2024)  Hunger Vital Sign    Worried About Running Out of Food in the Last Year: Never true    Ran Out of Food in the Last Year: Never true  Transportation Needs: No Transportation Needs (04/21/2024)   PRAPARE - Administrator, Civil Service (Medical): No    Lack of Transportation (Non-Medical): No  Physical Activity: Not on file  Stress: Not on file  Social Connections: Not on file  Intimate Partner Violence: Not At Risk (04/21/2024)   Humiliation, Afraid, Rape, and Kick questionnaire    Fear of Current or Ex-Partner: No    Emotionally Abused: No    Physically Abused: No    Sexually Abused: No      Review of Systems  HENT:         Clears throat a lot mainly at night.  Saliva feels thick when she tries to swallow.  This generally occurs at night and awakening with this.   She does snore.  Not sure if she has apneic episodes. She does have fatigue during day.  Has been an issue for 6 months.     Gastrointestinal:        She also has heartburn- burning in epigastrium and then goes up into throat.   Knows certain foods that worsen and stopped eating--spicy foods.  One cup coffee daily Was drinking coke several times weekly.  Stopped eating chocolate Takes ibuprofen  maybe 3 times in a month. Does eat tomatoes and onions No alcohol Does take omeprazole when has the symptoms.  It helps.     Musculoskeletal:        Triggering right little finger for about 4 months.        Objective:   BP 122/80 (BP Location: Left Arm, Patient Position: Sitting, Cuff Size: Normal)   Pulse 82   Resp 18   Ht 5' 1 (1.549 m)   Wt 197 lb (89.4 kg)   LMP 03/23/2024 (Approximate)   BMI 37.22 kg/m   Physical Exam Constitutional:      Appearance: She is obese.  HENT:     Head: Normocephalic and atraumatic.     Right Ear: Tympanic membrane, ear canal and external ear normal.     Left Ear: Tympanic membrane, ear canal and external ear normal.     Nose: Nose normal.     Mouth/Throat:     Mouth: Mucous membranes are moist.     Pharynx: Oropharynx is clear.  Eyes:     Extraocular Movements: Extraocular movements intact.     Conjunctiva/sclera: Conjunctivae normal.     Pupils: Pupils are equal, round, and reactive to light.     Comments: Discs sharp  Musculoskeletal:     Comments: Triggering right 5th digit with palpable pulley at palmar base of finger.    Neurological:     Mental Status: She is alert.      Assessment & Plan   CPE without pap--gynecological care with [facility] Mammogram through [facility] to return in 2 weeks. CBC, CMP FLP  2.  Right 5th trigger finger:  she will call for injection and splinting if does not improve.    3.  Throat clearing and GERD:  Not clear if snoring and apneic at times--she will get more history from her husband.  Does sound  like GERD as well.  Handout and encouraged decreasing foods that seem to worsen symptoms.  Weight loss. Famotidine  40 mg daily at bedtime  4.  Acanthosis nigricans and history of mild  hyperglycemia:  A1C and CMP, TSH

## 2024-05-07 ENCOUNTER — Other Ambulatory Visit: Payer: Self-pay

## 2024-07-28 ENCOUNTER — Ambulatory Visit: Payer: Self-pay | Admitting: Internal Medicine

## 2024-08-13 ENCOUNTER — Telehealth: Payer: Self-pay | Admitting: Psychology

## 2024-09-20 ENCOUNTER — Ambulatory Visit: Payer: Self-pay | Admitting: Internal Medicine

## 2025-04-20 ENCOUNTER — Other Ambulatory Visit: Payer: Self-pay

## 2025-04-25 ENCOUNTER — Encounter: Payer: Self-pay | Admitting: Internal Medicine
